# Patient Record
Sex: Male | Born: 1990 | Race: White | Hispanic: Yes | Marital: Single | Smoking: Never smoker
Health system: Southern US, Community
[De-identification: ages and names within clinical notes are randomized; demographics above are authoritative.]

## PROBLEM LIST (undated history)

## (undated) ENCOUNTER — Ambulatory Visit (HOSPITAL_COMMUNITY): Disposition: A | Discharge status: Home / Self Care | Payer: Managed Care, Other (non HMO)

## (undated) DIAGNOSIS — F32A Depression, unspecified: Secondary | ICD-10-CM

## (undated) DIAGNOSIS — F419 Anxiety disorder, unspecified: Secondary | ICD-10-CM

## (undated) DIAGNOSIS — F319 Bipolar disorder, unspecified: Secondary | ICD-10-CM

## (undated) DIAGNOSIS — K219 Gastro-esophageal reflux disease without esophagitis: Secondary | ICD-10-CM

## (undated) HISTORY — DX: Gastro-esophageal reflux disease without esophagitis: K21.9

## (undated) HISTORY — PX: OTHER SURGICAL HISTORY: SHX169

---

## 2011-09-11 ENCOUNTER — Encounter (HOSPITAL_COMMUNITY): Payer: Self-pay | Admitting: Emergency Medicine

## 2011-09-11 ENCOUNTER — Emergency Department (HOSPITAL_COMMUNITY)
Admission: EM | Admit: 2011-09-11 | Discharge: 2011-09-11 | Disposition: A | Discharge status: Home / Self Care | Payer: Self-pay | Attending: Emergency Medicine | Admitting: Emergency Medicine

## 2011-09-11 ENCOUNTER — Emergency Department (HOSPITAL_COMMUNITY): Payer: Self-pay

## 2011-09-11 DIAGNOSIS — R079 Chest pain, unspecified: Secondary | ICD-10-CM

## 2011-09-11 LAB — BASIC METABOLIC PANEL
BUN: 8 mg/dL (ref 6–23)
Calcium: 9.3 mg/dL (ref 8.4–10.5)
GFR calc non Af Amer: >90 mL/min (ref >90)
Glucose, Bld: 102 mg/dL — ABNORMAL HIGH (ref 70–99)
Sodium: 138 mEq/L (ref 135–145)

## 2011-09-11 LAB — CBC
Hemoglobin: 15.6 g/dL (ref 13.0–17.0)
MCH: 27.9 pg (ref 26.0–34.0)
MCHC: 34.4 g/dL (ref 30.0–36.0)

## 2011-09-11 NOTE — ED Provider Notes (Signed)
History     CSN: [id number]  Arrival date & time 09/11/11  7253   First MD Initiated Contact with Patient 09/11/11 0900      Chief Complaint  Patient presents with  . Chest Pain    (Consider location/radiation/quality/duration/timing/severity/associated sxs/prior treatment) Patient is a 21 y.o. male presenting with chest pain. The history is provided by the patient.  Chest Pain Pertinent negatives for primary symptoms include no fever, no shortness of breath, no cough, no palpitations, no abdominal pain and no vomiting.   pt c/o cp since yesterday. Constant. Pt describes vague chest tightness sensation midline/mid chest. Not pleuritic. Pain does not radiate. No worse whether upright or supine. At rest, states improved when working. States hx same symptoms in past intermittently but not this prolonged. Denies family hx premature cad. Denies smoking, no substance abuse, no cocaine abuse. Denies hx gerd. No cough or uri c/o. No associated sob, unusual doe, nv or diaphoresis. Denies any unusual stress or anxiety. No leg pain or swelling. No dvt or pe hx. No immobility, trauma, surgery. Denies palpitations. No heat intolerance, sweats, wt change, skin or hair changes. Denies hx chronic lung disease or asthma.     History reviewed. No pertinent past medical history.  History reviewed. No pertinent past surgical history.  No family history on file.  History  Substance Use Topics  . Smoking status: Never Smoker   . Smokeless tobacco: Not on file  . Alcohol Use: No      Review of Systems  Constitutional: Negative for fever and chills.  HENT: Negative for neck pain.   Eyes: Negative for redness.  Respiratory: Negative for cough and shortness of breath.   Cardiovascular: Positive for chest pain. Negative for palpitations and leg swelling.  Gastrointestinal: Negative for vomiting and abdominal pain.  Genitourinary: Negative for flank pain.  Musculoskeletal: Negative for back pain.    Skin: Negative for rash.  Neurological: Negative for headaches.  Hematological: Does not bruise/bleed easily.  Psychiatric/Behavioral: Negative for dysphoric mood.    Allergies  Review of patient's allergies indicates no known allergies.  Home Medications  No current outpatient prescriptions on file.  BP 145/89  Pulse 125  Temp 98.5 F (36.9 C) (Oral)  Resp 15  SpO2 100%  Physical Exam  Nursing note and vitals reviewed. Constitutional: He is oriented to person, place, and time. He appears well-developed and well-nourished. No distress.  HENT:  Head: Atraumatic.  Nose: Nose normal.  Mouth/Throat: Oropharynx is clear and moist.  Eyes: Conjunctivae are normal. Pupils are equal, round, and reactive to light.  Neck: Neck supple. No JVD present. No tracheal deviation present.  Cardiovascular: Normal rate, regular rhythm, normal heart sounds and intact distal pulses.  Exam reveals no gallop and no friction rub.   No murmur heard. Pulmonary/Chest: Effort normal and breath sounds normal. No accessory muscle usage. No respiratory distress. He exhibits no tenderness.  Abdominal: Soft. Bowel sounds are normal. He exhibits no distension. There is no tenderness.  Musculoskeletal: Normal range of motion. He exhibits no edema and no tenderness.  Neurological: He is alert and oriented to person, place, and time.  Skin: Skin is warm and dry.  Psychiatric: He has a normal mood and affect.    ED Course  Procedures (including critical care time)   Labs Reviewed  TROPONIN I  CBC  BASIC METABOLIC PANEL   Results for orders placed during the hospital encounter of 09/11/11  TROPONIN I      Component Value Range  Troponin I <0.30  <0.30 ng/mL  CBC      Component Value Range   WBC 11.9 (*) 4.0 - 10.5 K/uL   RBC 5.59  4.22 - 5.81 MIL/uL   Hemoglobin 15.6  13.0 - 17.0 g/dL   HCT 09.8  11.9 - 14.7 %   MCV 81.2  78.0 - 100.0 fL   MCH 27.9  26.0 - 34.0 pg   MCHC 34.4  30.0 - 36.0 g/dL    RDW 82.9  56.2 - 13.0 %   Platelets 322  150 - 400 K/uL  BASIC METABOLIC PANEL      Component Value Range   Sodium 138  135 - 145 mEq/L   Potassium 4.2  3.5 - 5.1 mEq/L   Chloride 101  96 - 112 mEq/L   CO2 28  19 - 32 mEq/L   Glucose, Bld 102 (*) 70 - 99 mg/dL   BUN 8  6 - 23 mg/dL   Creatinine, Ser 8.65  0.50 - 1.35 mg/dL   Calcium 9.3  8.4 - 78.4 mg/dL   GFR calc non Af Amer >90  >90 mL/min   GFR calc Af Amer >90  >90 mL/min   Dg Chest 2 View  09/11/2011  *RADIOLOGY REPORT*  Clinical Data: Chest pressure and tightness for 1 day.  CHEST - 2 VIEW  Comparison: None.  Findings: The heart, mediastinum and hila are within normal limits. The lungs are clear.  Normal bony thorax and surrounding soft tissues.  IMPRESSION: Normal chest radiographs.  Original Report Authenticated By: Domenic Moras, M.D.       MDM  Labs. Cxr.    Date: 09/11/2011  Rate: 114  Rhythm: sinus tachycardia  QRS Axis: normal  Intervals: normal  ST/T Wave abnormalities: normal  Conduction Disutrbances:none  Narrative Interpretation:   Old EKG Reviewed: none available   Recheck hr 94 rr 16, no distress.      Suzi Roots, MD 09/11/11 1056

## 2011-09-11 NOTE — ED Notes (Signed)
Pt c/o of chest tightness that started yesterday evening around 7pm. States that symptoms have occurred before but usually goes away. Denies SOB, anxiety, n/v/d

## 2011-09-15 ENCOUNTER — Encounter (HOSPITAL_COMMUNITY): Payer: Self-pay | Admitting: *Deleted

## 2011-09-15 ENCOUNTER — Emergency Department (HOSPITAL_COMMUNITY)
Admission: EM | Admit: 2011-09-15 | Discharge: 2011-09-15 | Disposition: A | Discharge status: Home / Self Care | Payer: Self-pay | Attending: Emergency Medicine | Admitting: Emergency Medicine

## 2011-09-15 DIAGNOSIS — R1013 Epigastric pain: Secondary | ICD-10-CM | POA: Insufficient documentation

## 2011-09-15 LAB — CBC WITH DIFFERENTIAL/PLATELET
Basophils Relative: 0 % (ref 0–1)
Eosinophils Absolute: 0.2 10*3/uL (ref 0.0–0.7)
MCH: 27.9 pg (ref 26.0–34.0)
MCHC: 34.2 g/dL (ref 30.0–36.0)
Neutrophils Relative %: 58 % (ref 43–77)
Platelets: 303 10*3/uL (ref 150–400)
RBC: 5.45 MIL/uL (ref 4.22–5.81)

## 2011-09-15 LAB — COMPREHENSIVE METABOLIC PANEL
ALT: 29 U/L (ref 0–53)
Albumin: 4.2 g/dL (ref 3.5–5.2)
Alkaline Phosphatase: 76 U/L (ref 39–117)
Potassium: 3.5 mEq/L (ref 3.5–5.1)
Sodium: 138 mEq/L (ref 135–145)
Total Protein: 7.6 g/dL (ref 6.0–8.3)

## 2011-09-15 MED ORDER — GI COCKTAIL ~~LOC~~
30.0000 mL | Freq: Once | ORAL | Status: AC
  Administered 2011-09-15: 30 mL via ORAL
  Filled 2011-09-15: qty 30

## 2011-09-15 MED ORDER — FAMOTIDINE 20 MG PO TABS: 20.0000 mg | ORAL_TABLET | Freq: Two times a day (BID) | ORAL | Status: DC

## 2011-09-15 NOTE — ED Notes (Signed)
Prescription given with discharge instructions.  

## 2011-09-15 NOTE — ED Provider Notes (Signed)
History     CSN: [id number]  Arrival date & time 09/15/11  0250   First MD Initiated Contact with Patient 09/15/11 [phone]      Chief Complaint  Patient presents with  . burping     (Consider location/radiation/quality/duration/timing/severity/associated sxs/prior treatment) HPI Comments: Patient states that for the last several, months he's had frequent episodes of burping and epigastric discomfort. He usually takes Alka-Seltzer with relief. Today. He spent birthing all day. Tried Alka-Seltzer and Advil relief. Today. He states that the pain radiated to his back as well. He does not have a family history of gallbladder disease. He states that eating food makes the discomfort worse. He said no, nausea or vomiting. Denies any fevers, shortness or dizziness    The history is provided by the patient.    History reviewed. No pertinent past medical history.  History reviewed. No pertinent past surgical history.  No family history on file.  History  Substance Use Topics  . Smoking status: Never Smoker   . Smokeless tobacco: Not on file  . Alcohol Use: No      Review of Systems  Constitutional: Negative for fever and chills.  HENT: Negative for rhinorrhea.   Respiratory: Negative for shortness of breath.   Cardiovascular: Negative for chest pain.  Gastrointestinal: Positive for abdominal pain. Negative for nausea and vomiting.  Skin: Negative for rash.  Neurological: Negative for dizziness, weakness and numbness.    Allergies  Review of patient's allergies indicates no known allergies.  Home Medications   Current Outpatient Rx  Name Route Sig Dispense Refill  . FAMOTIDINE 20 MG PO TABS Oral Take 1 tablet (20 mg total) by mouth 2 (two) times daily. 30 tablet 0    1 tablet twice a day for 7 days, then one tablet d ...    BP 146/82  Temp 97.8 F (36.6 C) (Oral)  Resp 18  SpO2 100%  Physical Exam  Constitutional: He appears well-developed and well-nourished.  HENT:   Head: Normocephalic.  Eyes: Pupils are equal, round, and reactive to light.  Cardiovascular: Normal rate.   Pulmonary/Chest: Effort normal. No respiratory distress. He has no wheezes. He has no rales. He exhibits no tenderness.  Abdominal: Soft. Bowel sounds are normal. He exhibits no distension. There is no hepatosplenomegaly. There is tenderness in the epigastric area.  Musculoskeletal: Normal range of motion.  Neurological: He is alert.  Skin: Skin is warm and dry.    ED Course  Procedures (including critical care time)  Labs Reviewed  CBC WITH DIFFERENTIAL - Abnormal; Notable for the following:    Monocytes Absolute 1.1 (*)     All other components within normal limits  COMPREHENSIVE METABOLIC PANEL   No results found.   1. Epigastric discomfort       MDM   That the patient be given a GI cocktail, also that CBC, and Cmet. be performed as his story is concerning for gallbladder disease Review of labs. There is no indication of gallbladder disease. No sign of infection at this time. Patient did receive quite a bit of relief from his discomfort after the GI cocktail. Will discharge patient home with a prescription for Pepcid twice a day for one week and Pepcid daily thereafter with a referral to GI for further evaluation as needed       Arman Filter, NP 09/15/11 0437  Arman Filter, NP 09/15/11 [phone]

## 2011-09-15 NOTE — ED Notes (Signed)
Burping all day today and c/o a burning sensation in his abd also

## 2011-09-15 NOTE — ED Provider Notes (Signed)
Medical screening examination/treatment/procedure(s) were performed by non-physician practitioner and as supervising physician I was immediately available for consultation/collaboration.  Olivia Mackie, MD 09/15/11 [phone]

## 2012-01-28 ENCOUNTER — Ambulatory Visit: Payer: Self-pay | Admitting: Internal Medicine

## 2012-02-22 ENCOUNTER — Ambulatory Visit (INDEPENDENT_AMBULATORY_CARE_PROVIDER_SITE_OTHER): Payer: Managed Care, Other (non HMO) | Admitting: Internal Medicine

## 2012-02-22 ENCOUNTER — Other Ambulatory Visit (INDEPENDENT_AMBULATORY_CARE_PROVIDER_SITE_OTHER): Payer: Managed Care, Other (non HMO)

## 2012-02-22 ENCOUNTER — Encounter: Payer: Self-pay | Admitting: Internal Medicine

## 2012-02-22 VITALS — BP 112/70 | HR 86 | Temp 97.7°F | Resp 10 | Ht 69.0 in | Wt 243.1 lb

## 2012-02-22 DIAGNOSIS — R7309 Other abnormal glucose: Secondary | ICD-10-CM

## 2012-02-22 DIAGNOSIS — Z0000 Encounter for general adult medical examination without abnormal findings: Secondary | ICD-10-CM

## 2012-02-22 DIAGNOSIS — K219 Gastro-esophageal reflux disease without esophagitis: Secondary | ICD-10-CM

## 2012-02-22 LAB — COMPREHENSIVE METABOLIC PANEL
Albumin: 4.3 g/dL (ref 3.5–5.2)
Alkaline Phosphatase: 72 U/L (ref 39–117)
BUN: 10 mg/dL (ref 6–23)
CO2: 28 mEq/L (ref 19–32)
Calcium: 9.2 mg/dL (ref 8.4–10.5)
GFR: 132.04 mL/min (ref >60.00)
Glucose, Bld: 92 mg/dL (ref 70–99)
Potassium: 4.6 mEq/L (ref 3.5–5.1)

## 2012-02-22 LAB — CBC WITH DIFFERENTIAL/PLATELET
Basophils Relative: 0.4 % (ref 0.0–3.0)
Eosinophils Relative: 1.8 % (ref 0.0–5.0)
Lymphocytes Relative: 27.8 % (ref 12.0–46.0)
MCV: 83.1 fl (ref 78.0–100.0)
Monocytes Relative: 8.5 % (ref 3.0–12.0)
Neutrophils Relative %: 61.5 % (ref 43.0–77.0)
RBC: 5.5 Mil/uL (ref 4.22–5.81)
WBC: 8.2 10*3/uL (ref 4.5–10.5)

## 2012-02-22 LAB — TSH: TSH: 1.4 u[IU]/mL (ref 0.35–5.50)

## 2012-02-22 LAB — URINALYSIS, ROUTINE W REFLEX MICROSCOPIC
Ketones, ur: NEGATIVE
Leukocytes, UA: NEGATIVE
Specific Gravity, Urine: 1.02 (ref 1.000–1.030)
Urine Glucose: NEGATIVE
pH: 6.5 (ref 5.0–8.0)

## 2012-02-22 LAB — LIPID PANEL
Cholesterol: 123 mg/dL (ref 0–200)
LDL Cholesterol: 76 mg/dL (ref 0–99)
Triglycerides: 67 mg/dL (ref 0.0–149.0)

## 2012-02-22 NOTE — Assessment & Plan Note (Signed)
I will check his a1c to see if he has developed DM

## 2012-02-22 NOTE — Progress Notes (Signed)
Subjective:    Patient ID: Trevor Brewer, male    DOB: 04-02-1990, 22 y.o.   MRN: [id number]  Gastrophageal Reflux He complains of heartburn. He reports no abdominal pain, no belching, no chest pain, no choking, no coughing, no dysphagia, no early satiety, no globus sensation, no hoarse voice, no nausea, no sore throat, no stridor, no tooth decay, no water brash or no wheezing. This is a chronic problem. The current episode started more than 1 year ago. The problem occurs rarely. The problem has been gradually improving. The heartburn duration is several minutes. The heartburn is located in the substernum. The heartburn is of mild intensity. The heartburn does not wake him from sleep. The heartburn does not limit his activity. The heartburn doesn't change with position. Nothing aggravates the symptoms. Pertinent negatives include no anemia, fatigue, melena, muscle weakness, orthopnea or weight loss. Risk factors include lack of exercise. He has tried a histamine-2 antagonist for the symptoms. The treatment provided significant relief.      Review of Systems  Constitutional: Positive for activity change (increased appetite). Negative for fever, chills, weight loss, diaphoresis, appetite change, fatigue and unexpected weight change.  HENT: Negative.  Negative for sore throat and hoarse voice.   Eyes: Negative.   Respiratory: Negative for cough, choking, chest tightness, shortness of breath, wheezing and stridor.   Cardiovascular: Negative for chest pain, palpitations and leg swelling.  Gastrointestinal: Positive for heartburn. Negative for dysphagia, nausea, vomiting, abdominal pain, diarrhea, constipation, melena and abdominal distention.  Genitourinary: Positive for frequency. Negative for dysuria, urgency, hematuria, flank pain, decreased urine volume, discharge, penile swelling, scrotal swelling, enuresis, difficulty urinating, genital sores, penile pain and testicular pain.  Musculoskeletal:  Negative.  Negative for muscle weakness.  Skin: Negative.   Neurological: Negative.  Negative for dizziness, speech difficulty, weakness and light-headedness.  Hematological: Negative for adenopathy. Does not bruise/bleed easily.  Psychiatric/Behavioral: Negative.        Objective:   Physical Exam  Vitals reviewed. Constitutional: He is oriented to person, place, and time. He appears well-developed and well-nourished. No distress.  HENT:  Head: Normocephalic and atraumatic.  Mouth/Throat: Oropharynx is clear and moist. No oropharyngeal exudate.  Eyes: Conjunctivae normal are normal. Right eye exhibits no discharge. Left eye exhibits no discharge. No scleral icterus.  Neck: Normal range of motion. Neck supple. No JVD present. No tracheal deviation present. No thyromegaly present.  Cardiovascular: Normal rate, regular rhythm, normal heart sounds and intact distal pulses.  Exam reveals no gallop and no friction rub.   No murmur heard. Pulmonary/Chest: Effort normal and breath sounds normal. No stridor. No respiratory distress. He has no wheezes. He has no rales. He exhibits no tenderness.  Abdominal: Soft. Bowel sounds are normal. He exhibits no distension and no mass. There is no tenderness. There is no rebound and no guarding. Hernia confirmed negative in the right inguinal area and confirmed negative in the left inguinal area.  Genitourinary: Testes normal and penis normal. Right testis shows no mass, no swelling and no tenderness. Right testis is descended. Left testis shows no mass, no swelling and no tenderness. Left testis is descended. Uncircumcised. No phimosis, paraphimosis, hypospadias, penile erythema or penile tenderness. No discharge found.  Musculoskeletal: Normal range of motion. He exhibits no edema and no tenderness.  Lymphadenopathy:    He has no cervical adenopathy.       Right: No inguinal adenopathy present.       Left: No inguinal adenopathy present.  Neurological: He  is  oriented to person, place, and time.  Skin: Skin is warm and dry. No rash noted. He is not diaphoretic. No erythema. No pallor.  Psychiatric: He has a normal mood and affect. His behavior is normal. Judgment and thought content normal.      Lab Results  Component Value Date   WBC 9.8 09/15/2011   HGB 15.2 09/15/2011   HCT 44.5 09/15/2011   PLT 303 09/15/2011   GLUCOSE 99 09/15/2011   ALT 29 09/15/2011   AST 20 09/15/2011   NA 138 09/15/2011   K 3.5 09/15/2011   CL 101 09/15/2011   CREATININE 0.70 09/15/2011   BUN 7 09/15/2011   CO2 26 09/15/2011      Assessment & Plan:

## 2012-02-22 NOTE — Assessment & Plan Note (Signed)
He is doing well on the H2 blocker

## 2012-02-22 NOTE — Patient Instructions (Signed)
Health Maintenance, Males A healthy lifestyle and preventative care can promote health and wellness.  Maintain regular health, dental, and eye exams.  Eat a healthy diet. Foods like vegetables, fruits, whole grains, low-fat dairy products, and lean protein foods contain the nutrients you need without too many calories. Decrease your intake of foods high in solid fats, added sugars, and salt. Get information about a proper diet from your caregiver, if necessary.  Regular physical exercise is one of the most important things you can do for your health. Most adults should get at least 150 minutes of moderate-intensity exercise (any activity that increases your heart rate and causes you to sweat) each week. In addition, most adults need muscle-strengthening exercises on 2 or more days a week.   Maintain a healthy weight. The body mass index (BMI) is a screening tool to identify possible weight problems. It provides an estimate of body fat based on height and weight. Your caregiver can help determine your BMI, and can help you achieve or maintain a healthy weight. For adults 20 years and older:  A BMI below 18.5 is considered underweight.  A BMI of 18.5 to 24.9 is normal.  A BMI of 25 to 29.9 is considered overweight.  A BMI of 30 and above is considered obese.  Maintain normal blood lipids and cholesterol by exercising and minimizing your intake of saturated fat. Eat a balanced diet with plenty of fruits and vegetables. Blood tests for lipids and cholesterol should begin at age 20 and be repeated every 5 years. If your lipid or cholesterol levels are high, you are over 50, or you are a high risk for heart disease, you may need your cholesterol levels checked more frequently.Ongoing high lipid and cholesterol levels should be treated with medicines, if diet and exercise are not effective.  If you smoke, find out from your caregiver how to quit. If you do not use tobacco, do not start.  If you  choose to drink alcohol, do not exceed 2 drinks per day. One drink is considered to be 12 ounces (355 mL) of beer, 5 ounces (148 mL) of wine, or 1.5 ounces (44 mL) of liquor.  Avoid use of street drugs. Do not share needles with anyone. Ask for help if you need support or instructions about stopping the use of drugs.  High blood pressure causes heart disease and increases the risk of stroke. Blood pressure should be checked at least every 1 to 2 years. Ongoing high blood pressure should be treated with medicines if weight loss and exercise are not effective.  If you are 45 to 23 years old, ask your caregiver if you should take aspirin to prevent heart disease.  Diabetes screening involves taking a blood sample to check your fasting blood sugar level. This should be done once every 3 years, after age 45, if you are within normal weight and without risk factors for diabetes. Testing should be considered at a younger age or be carried out more frequently if you are overweight and have at least 1 risk factor for diabetes.  Colorectal cancer can be detected and often prevented. Most routine colorectal cancer screening begins at the age of 50 and continues through age 75. However, your caregiver may recommend screening at an earlier age if you have risk factors for colon cancer. On a yearly basis, your caregiver may provide home test kits to check for hidden blood in the stool. Use of a small camera at the end of a tube,   to directly examine the colon (sigmoidoscopy or colonoscopy), can detect the earliest forms of colorectal cancer. Talk to your caregiver about this at age 50, when routine screening begins. Direct examination of the colon should be repeated every 5 to 10 years through age 75, unless early forms of pre-cancerous polyps or small growths are found.  Hepatitis C blood testing is recommended for all people born from 1945 through 1965 and any individual with known risks for hepatitis C.  Healthy  men should no longer receive prostate-specific antigen (PSA) blood tests as part of routine cancer screening. Consult with your caregiver about prostate cancer screening.  Testicular cancer screening is not recommended for adolescents or adult males who have no symptoms. Screening includes self-exam, caregiver exam, and other screening tests. Consult with your caregiver about any symptoms you have or any concerns you have about testicular cancer.  Practice safe sex. Use condoms and avoid high-risk sexual practices to reduce the spread of sexually transmitted infections (STIs).  Use sunscreen with a sun protection factor (SPF) of 30 or greater. Apply sunscreen liberally and repeatedly throughout the day. You should seek shade when your shadow is shorter than you. Protect yourself by wearing long sleeves, pants, a wide-brimmed hat, and sunglasses year round, whenever you are outdoors.  Notify your caregiver of new moles or changes in moles, especially if there is a change in shape or color. Also notify your caregiver if a mole is larger than the size of a pencil eraser.  A one-time screening for abdominal aortic aneurysm (AAA) and surgical repair of large AAAs by sound wave imaging (ultrasonography) is recommended for ages 65 to 75 years who are current or former smokers.  Stay current with your immunizations. Document Released: 07/14/2007 Document Revised: 04/09/2011 Document Reviewed: 06/12/2010 [employer] Patient Information 2013 [employer].  

## 2012-02-22 NOTE — Assessment & Plan Note (Signed)
Exam done Vaccines were reviewed Labs ordered Pt ed material was given 

## 2012-02-25 ENCOUNTER — Encounter: Payer: Self-pay | Admitting: Internal Medicine

## 2012-06-19 ENCOUNTER — Encounter: Payer: Self-pay | Admitting: Internal Medicine

## 2012-06-19 ENCOUNTER — Ambulatory Visit (INDEPENDENT_AMBULATORY_CARE_PROVIDER_SITE_OTHER): Payer: Managed Care, Other (non HMO) | Admitting: Internal Medicine

## 2012-06-19 ENCOUNTER — Ambulatory Visit (INDEPENDENT_AMBULATORY_CARE_PROVIDER_SITE_OTHER)
Admission: RE | Admit: 2012-06-19 | Discharge: 2012-06-19 | Disposition: A | Discharge status: Home / Self Care | Payer: Managed Care, Other (non HMO) | Source: Ambulatory Visit | Attending: Internal Medicine | Admitting: Internal Medicine

## 2012-06-19 ENCOUNTER — Other Ambulatory Visit (INDEPENDENT_AMBULATORY_CARE_PROVIDER_SITE_OTHER): Payer: Managed Care, Other (non HMO)

## 2012-06-19 VITALS — BP 114/70 | HR 107 | Temp 99.0°F | Resp 16 | Wt 247.0 lb

## 2012-06-19 DIAGNOSIS — R079 Chest pain, unspecified: Secondary | ICD-10-CM

## 2012-06-19 DIAGNOSIS — R000 Tachycardia, unspecified: Secondary | ICD-10-CM

## 2012-06-19 LAB — CBC WITH DIFFERENTIAL/PLATELET
Basophils Relative: 0.5 % (ref 0.0–3.0)
Eosinophils Absolute: 0.1 10*3/uL (ref 0.0–0.7)
Eosinophils Relative: 1.4 % (ref 0.0–5.0)
Lymphocytes Relative: 18.6 % (ref 12.0–46.0)
Neutrophils Relative %: 70.9 % (ref 43.0–77.0)
Platelets: 327 10*3/uL (ref 150.0–400.0)
RBC: 5.71 Mil/uL (ref 4.22–5.81)
WBC: 8.7 10*3/uL (ref 4.5–10.5)

## 2012-06-19 LAB — CARDIAC PANEL
CK-MB: 1.6 ng/mL (ref 0.3–4.0)
Relative Index: 0.5 calc (ref 0.0–2.5)
Total CK: 346 U/L — ABNORMAL HIGH (ref 7–232)

## 2012-06-19 LAB — COMPREHENSIVE METABOLIC PANEL
AST: 24 U/L (ref 0–37)
Albumin: 4.2 g/dL (ref 3.5–5.2)
Alkaline Phosphatase: 71 U/L (ref 39–117)
BUN: 10 mg/dL (ref 6–23)
Calcium: 9.3 mg/dL (ref 8.4–10.5)
Chloride: 102 mEq/L (ref 96–112)
Glucose, Bld: 85 mg/dL (ref 70–99)
Potassium: 4.4 mEq/L (ref 3.5–5.1)
Sodium: 136 mEq/L (ref 135–145)
Total Protein: 7.8 g/dL (ref 6.0–8.3)

## 2012-06-19 LAB — BRAIN NATRIURETIC PEPTIDE: Pro B Natriuretic peptide (BNP): 10 pg/mL (ref 0.0–100.0)

## 2012-06-19 NOTE — Assessment & Plan Note (Signed)
I will check his labs and UDS to look for secondary causes for this I have asked him to wear a Holter monitor to ses how high his HR is getting and to see if there is any dysrhythmia

## 2012-06-19 NOTE — Progress Notes (Signed)
Subjective:    Patient ID: Trevor Brewer, male    DOB: Jun 05, 1990, 22 y.o.   MRN: [id number]  Chest Pain  This is a recurrent problem. Episode onset: 3 weeks ago. The onset quality is gradual. The problem occurs intermittently. The problem has been unchanged. The pain is present in the lateral region (right side of his chest). The pain is at a severity of 2/10. The pain is mild. The quality of the pain is described as sharp and stabbing. The pain does not radiate. Associated symptoms include palpitations. Pertinent negatives include no abdominal pain, back pain, claudication, cough, diaphoresis, dizziness, exertional chest pressure, fever, headaches, hemoptysis, irregular heartbeat, leg pain, lower extremity edema, malaise/fatigue, nausea, near-syncope, numbness, orthopnea, PND, shortness of breath, sputum production, syncope, vomiting or weakness. The pain is aggravated by emotional upset. He has tried NSAIDs for the symptoms. The treatment provided mild relief.      Review of Systems  Constitutional: Negative.  Negative for fever, chills, malaise/fatigue, diaphoresis, activity change, appetite change, fatigue and unexpected weight change.  HENT: Negative.   Eyes: Negative.   Respiratory: Negative.  Negative for apnea, cough, hemoptysis, sputum production, choking, chest tightness, shortness of breath, wheezing and stridor.   Cardiovascular: Positive for chest pain and palpitations. Negative for orthopnea, claudication, leg swelling, syncope, PND and near-syncope.  Gastrointestinal: Negative.  Negative for nausea, vomiting, abdominal pain, diarrhea and constipation.  Endocrine: Negative.   Genitourinary: Negative.   Musculoskeletal: Negative.  Negative for back pain.  Skin: Negative.   Allergic/Immunologic: Negative.   Neurological: Negative.  Negative for dizziness, weakness, numbness and headaches.  Hematological: Negative.  Negative for adenopathy. Does not bruise/bleed easily.   Psychiatric/Behavioral: Negative for suicidal ideas, hallucinations, behavioral problems, confusion, sleep disturbance, self-injury, dysphoric mood, decreased concentration and agitation. The patient is nervous/anxious. The patient is not hyperactive.        Objective:   Physical Exam  Vitals reviewed. Constitutional: He is oriented to person, place, and time. He appears well-developed and well-nourished. No distress.  HENT:  Head: Normocephalic and atraumatic.  Mouth/Throat: Oropharynx is clear and moist. No oropharyngeal exudate.  Eyes: Conjunctivae are normal. Right eye exhibits no discharge. Left eye exhibits no discharge. No scleral icterus.  Neck: Normal range of motion. Neck supple. No JVD present. No tracheal deviation present. No thyromegaly present.  Cardiovascular: Regular rhythm, S1 normal, S2 normal, normal heart sounds and intact distal pulses.   No extrasystoles are present. Tachycardia present.  Exam reveals no gallop.   No murmur heard. Pulses:      Carotid pulses are 1+ on the right side, and 1+ on the left side.      Radial pulses are 1+ on the right side, and 1+ on the left side.       Femoral pulses are 1+ on the right side, and 1+ on the left side.      Popliteal pulses are 1+ on the right side, and 1+ on the left side.       Dorsalis pedis pulses are 1+ on the right side, and 1+ on the left side.       Posterior tibial pulses are 1+ on the right side, and 1+ on the left side.  Pulmonary/Chest: Effort normal and breath sounds normal. No stridor. No respiratory distress. He has no wheezes. He has no rales. He exhibits no tenderness.  Abdominal: Soft. Bowel sounds are normal. He exhibits no distension and no mass. There is no tenderness. There is no rebound and no  guarding.  Musculoskeletal: Normal range of motion. He exhibits no edema and no tenderness.  Lymphadenopathy:    He has no cervical adenopathy.  Neurological: He is oriented to person, place, and time.   Skin: Skin is warm and dry. He is not diaphoretic.  Psychiatric: His speech is normal and behavior is normal. Judgment and thought content normal. His mood appears anxious. His affect is not angry, not labile and not inappropriate. Cognition and memory are normal. He does not exhibit a depressed mood.          Assessment & Plan:

## 2012-06-19 NOTE — Patient Instructions (Signed)
Chest Pain (Nonspecific) °It is often hard to give a specific diagnosis for the cause of chest pain. There is always a chance that your pain could be related to something serious, such as a heart attack or a blood clot in the lungs. You need to follow up with your caregiver for further evaluation. °CAUSES  °· Heartburn. °· Pneumonia or bronchitis. °· Anxiety or stress. °· Inflammation around your heart (pericarditis) or lung (pleuritis or pleurisy). °· A blood clot in the lung. °· A collapsed lung (pneumothorax). It can develop suddenly on its own (spontaneous pneumothorax) or from injury (trauma) to the chest. °· Shingles infection (herpes zoster virus). °The chest wall is composed of bones, muscles, and cartilage. Any of these can be the source of the pain. °· The bones can be bruised by injury. °· The muscles or cartilage can be strained by coughing or overwork. °· The cartilage can be affected by inflammation and become sore (costochondritis). °DIAGNOSIS  °Lab tests or other studies, such as X-rays, electrocardiography, stress testing, or cardiac imaging, may be needed to find the cause of your pain.  °TREATMENT  °· Treatment depends on what may be causing your chest pain. Treatment may include: °· Acid blockers for heartburn. °· Anti-inflammatory medicine. °· Pain medicine for inflammatory conditions. °· Antibiotics if an infection is present. °· You may be advised to change lifestyle habits. This includes stopping smoking and avoiding alcohol, caffeine, and chocolate. °· You may be advised to keep your head raised (elevated) when sleeping. This reduces the chance of acid going backward from your stomach into your esophagus. °· Most of the time, nonspecific chest pain will improve within 2 to 3 days with rest and mild pain medicine. °HOME CARE INSTRUCTIONS  °· If antibiotics were prescribed, take your antibiotics as directed. Finish them even if you start to feel better. °· For the next few days, avoid physical  activities that bring on chest pain. Continue physical activities as directed. °· Do not smoke. °· Avoid drinking alcohol. °· Only take over-the-counter or prescription medicine for pain, discomfort, or fever as directed by your caregiver. °· Follow your caregiver's suggestions for further testing if your chest pain does not go away. °· Keep any follow-up appointments you made. If you do not go to an appointment, you could develop lasting (chronic) problems with pain. If there is any problem keeping an appointment, you must call to reschedule. °SEEK MEDICAL CARE IF:  °· You think you are having problems from the medicine you are taking. Read your medicine instructions carefully. °· Your chest pain does not go away, even after treatment. °· You develop a rash with blisters on your chest. °SEEK IMMEDIATE MEDICAL CARE IF:  °· You have increased chest pain or pain that spreads to your arm, neck, jaw, back, or abdomen. °· You develop shortness of breath, an increasing cough, or you are coughing up blood. °· You have severe back or abdominal pain, feel nauseous, or vomit. °· You develop severe weakness, fainting, or chills. °· You have a fever. °THIS IS AN EMERGENCY. Do not wait to see if the pain will go away. Get medical help at once. Call your local emergency services (911 in [place] Do not drive yourself to the hospital. °MAKE SURE YOU:  °· Understand these instructions. °· Will watch your condition. °· Will get help right away if you are not doing well or get worse. °Document Released: 10/25/2004 Document Revised: 04/09/2011 Document Reviewed: 08/21/2007 °ExitCare® Patient Information ©2014 [employer],   LL[employer]. ° °

## 2012-06-19 NOTE — Assessment & Plan Note (Addendum)
CXR is normal Labs are ok (CK elevation is not CK-MB) EKG shows tachy but it is o/w normal I think this is anxiety/MS pain, I will check his UDS

## 2012-06-20 LAB — DRUGS OF ABUSE SCREEN W/O ALC, ROUTINE URINE
Amphetamine Screen, Ur: NEGATIVE
Barbiturate Quant, Ur: NEGATIVE
Benzodiazepines.: NEGATIVE
Marijuana Metabolite: NEGATIVE
Methadone: NEGATIVE
Propoxyphene: NEGATIVE

## 2012-07-01 ENCOUNTER — Encounter (INDEPENDENT_AMBULATORY_CARE_PROVIDER_SITE_OTHER): Payer: Managed Care, Other (non HMO)

## 2012-07-01 ENCOUNTER — Encounter: Payer: Self-pay | Admitting: *Deleted

## 2012-07-01 DIAGNOSIS — R000 Tachycardia, unspecified: Secondary | ICD-10-CM

## 2012-07-01 DIAGNOSIS — R002 Palpitations: Secondary | ICD-10-CM

## 2012-07-01 NOTE — Progress Notes (Signed)
Patient ID: Trevor Brewer, male   DOB: 1990/10/31, 22 y.o.   MRN: [id number] E-Cardio 24 hour Holter monitor placed on patient.

## 2012-07-04 ENCOUNTER — Other Ambulatory Visit: Payer: Self-pay | Admitting: Internal Medicine

## 2012-07-04 DIAGNOSIS — R000 Tachycardia, unspecified: Secondary | ICD-10-CM

## 2012-07-10 ENCOUNTER — Telehealth: Payer: Self-pay | Admitting: Internal Medicine

## 2012-07-10 NOTE — Telephone Encounter (Signed)
I have called him twice to inform him of the elevated heart rate on the holter monitor, he has not returns my phone calls. I have referred him to cardiology.

## 2012-08-28 ENCOUNTER — Institutional Professional Consult (permissible substitution): Payer: Managed Care, Other (non HMO) | Admitting: Cardiovascular Disease

## 2012-09-09 ENCOUNTER — Encounter: Payer: Self-pay | Admitting: Cardiovascular Disease

## 2014-02-16 ENCOUNTER — Emergency Department (HOSPITAL_COMMUNITY)
Admission: EM | Admit: 2014-02-16 | Discharge: 2014-02-16 | Disposition: A | Discharge status: Home / Self Care | Payer: Managed Care, Other (non HMO) | Attending: Emergency Medicine | Admitting: Emergency Medicine

## 2014-02-16 ENCOUNTER — Emergency Department (HOSPITAL_COMMUNITY): Payer: Managed Care, Other (non HMO)

## 2014-02-16 ENCOUNTER — Encounter (HOSPITAL_COMMUNITY): Payer: Self-pay

## 2014-02-16 DIAGNOSIS — K219 Gastro-esophageal reflux disease without esophagitis: Secondary | ICD-10-CM | POA: Insufficient documentation

## 2014-02-16 DIAGNOSIS — Z79899 Other long term (current) drug therapy: Secondary | ICD-10-CM | POA: Insufficient documentation

## 2014-02-16 DIAGNOSIS — Y998 Other external cause status: Secondary | ICD-10-CM | POA: Insufficient documentation

## 2014-02-16 DIAGNOSIS — Y9389 Activity, other specified: Secondary | ICD-10-CM | POA: Insufficient documentation

## 2014-02-16 DIAGNOSIS — S3992XA Unspecified injury of lower back, initial encounter: Secondary | ICD-10-CM | POA: Insufficient documentation

## 2014-02-16 DIAGNOSIS — S4992XA Unspecified injury of left shoulder and upper arm, initial encounter: Secondary | ICD-10-CM | POA: Insufficient documentation

## 2014-02-16 DIAGNOSIS — Y92410 Unspecified street and highway as the place of occurrence of the external cause: Secondary | ICD-10-CM | POA: Insufficient documentation

## 2014-02-16 DIAGNOSIS — S52612A Displaced fracture of left ulna styloid process, initial encounter for closed fracture: Secondary | ICD-10-CM

## 2014-02-16 DIAGNOSIS — S199XXA Unspecified injury of neck, initial encounter: Secondary | ICD-10-CM | POA: Insufficient documentation

## 2014-02-16 DIAGNOSIS — S52615A Nondisplaced fracture of left ulna styloid process, initial encounter for closed fracture: Secondary | ICD-10-CM | POA: Insufficient documentation

## 2014-02-16 MED ORDER — HYDROCODONE-ACETAMINOPHEN 5-325 MG PO TABS: ORAL_TABLET | ORAL | Status: DC

## 2014-02-16 MED ORDER — IBUPROFEN 600 MG PO TABS: 600.0000 mg | ORAL_TABLET | Freq: Four times a day (QID) | ORAL | Status: DC | PRN

## 2014-02-16 MED ORDER — IBUPROFEN 800 MG PO TABS
800.0000 mg | ORAL_TABLET | Freq: Once | ORAL | Status: AC
  Administered 2014-02-16: 800 mg via ORAL
  Filled 2014-02-16: qty 1
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
## 2014-08-05 ENCOUNTER — Emergency Department (HOSPITAL_COMMUNITY)
Admission: EM | Admit: 2014-08-05 | Discharge: 2014-08-05 | Disposition: A | Discharge status: Home / Self Care | Payer: Managed Care, Other (non HMO) | Attending: Emergency Medicine | Admitting: Emergency Medicine

## 2014-08-05 ENCOUNTER — Encounter (HOSPITAL_COMMUNITY): Payer: Self-pay | Admitting: *Deleted

## 2014-08-05 DIAGNOSIS — R319 Hematuria, unspecified: Secondary | ICD-10-CM | POA: Diagnosis not present

## 2014-08-05 DIAGNOSIS — R369 Urethral discharge, unspecified: Secondary | ICD-10-CM | POA: Diagnosis present

## 2014-08-05 DIAGNOSIS — K219 Gastro-esophageal reflux disease without esophagitis: Secondary | ICD-10-CM | POA: Diagnosis not present

## 2014-08-05 DIAGNOSIS — Z79899 Other long term (current) drug therapy: Secondary | ICD-10-CM | POA: Diagnosis not present

## 2014-08-05 LAB — URINALYSIS, ROUTINE W REFLEX MICROSCOPIC
Bilirubin Urine: NEGATIVE
GLUCOSE, UA: NEGATIVE mg/dL
Ketones, ur: NEGATIVE mg/dL
Leukocytes, UA: NEGATIVE
Nitrite: NEGATIVE
Protein, ur: NEGATIVE mg/dL
SPECIFIC GRAVITY, URINE: 1.023 (ref 1.005–1.030)
Urobilinogen, UA: 1 mg/dL (ref 0.0–1.0)
pH: 6 (ref 5.0–8.0)

## 2014-08-05 LAB — URINE MICROSCOPIC-ADD ON

## 2014-08-05 NOTE — Discharge Instructions (Signed)
Hematuria Hematuria is blood in your urine. It can be caused by a bladder infection, kidney infection, prostate infection, kidney stone, or cancer of your urinary tract. Infections can usually be treated with medicine, and a kidney stone usually will pass through your urine. If neither of these is the cause of your hematuria, further workup to find out the reason may be needed. It is very important that you tell your health care provider about any blood you see in your urine, even if the blood stops without treatment or happens without causing pain. Blood in your urine that happens and then stops and then happens again can be a symptom of a very serious condition. Also, pain is not a symptom in the initial stages of many urinary cancers. HOME CARE INSTRUCTIONS   Drink lots of fluid, 3-4 quarts a day. If you have been diagnosed with an infection, cranberry juice is especially recommended, in addition to large amounts of water.  Avoid caffeine, tea, and carbonated beverages because they tend to irritate the bladder.  Avoid alcohol because it may irritate the prostate.  Take all medicines as directed by your health care provider.  If you were prescribed an antibiotic medicine, finish it all even if you start to feel better.  If you have been diagnosed with a kidney stone, follow your health care provider's instructions regarding straining your urine to catch the stone.  Empty your bladder often. Avoid holding urine for long periods of time.  After a bowel movement, women should cleanse front to back. Use each tissue only once.  Empty your bladder before and after sexual intercourse if you are a male. SEEK MEDICAL CARE IF:  You develop back pain.  You have a fever.  You have a feeling of sickness in your stomach (nausea) or vomiting.  Your symptoms are not better in 3 days. Return sooner if you are getting worse. SEEK IMMEDIATE MEDICAL CARE IF:   You develop severe vomiting and are  unable to keep the medicine down.  You develop severe back or abdominal pain despite taking your medicines.  You begin passing a large amount of blood or clots in your urine.  You feel extremely weak or faint, or you pass out. MAKE SURE YOU:   Understand these instructions.  Will watch your condition.  Will get help right away if you are not doing well or get worse. Document Released: 01/15/2005 Document Revised: 06/01/2013 Document Reviewed: 09/15/2012 [facility] Patient Information 2015 [place]ExitCare, [place]LL[place]. This information is not intended to replace advice given to you by your health care provider. Make sure you discuss any questions you have with your health care provider. Your urine shows microscopic hematuria or blood in your urine without any sign of infection.  Please call the urologist to make an appointment for further evaluation.  Return or HIV.  If you develop fever, nausea, vomiting

## 2014-08-05 NOTE — ED Notes (Signed)
Pt states his ejaculate had a reddish tinge to it today after masturbating. Pt states his ejaculate was normal yesterday. Pt also complains of burning when he urinates, starting at 12PM today.

## 2014-08-05 NOTE — ED Provider Notes (Signed)
CSN: [id number]     Arrival date & time 08/05/14  2009 History  This chart was scribed for Earley FavorGail Gurtej Noyola, NP, working with Arby BarretteMarcy Pfeiffer, MD by Chestine SporeSoijett Blue, ED Scribe. The patient was seen in room WTR9/WTR9 at 8:23 PM.      Chief Complaint  Patient presents with  . Penile Discharge      The history is provided by the patient. No language interpreter was used.    HPI Comments: Trevor Brewer is a 24 y.o. male who presents to the Emergency Department complaining of penile discharge onset 1-2 hours ago. Pt notes that he noticed that his ejaculate today had a red tinge color to it and his ejaculate yesterday was normal. Pt last had protected sex 1 week ago. He states that he is having associated symptoms of dysuria beginning at noon today. He denies hematuria and any other symptoms.   Past Medical History  Diagnosis Date  . GERD (gastroesophageal reflux disease)    Past Surgical History  Procedure Laterality Date  . Arm surgery      fracture   Family History  Problem Relation Age of Onset  . Diabetes Paternal Uncle   . Hypertension Paternal Uncle   . Alcohol abuse Neg Hx   . Cancer Neg Hx   . Early death Neg Hx   . Heart disease Neg Hx   . Hyperlipidemia Neg Hx   . Kidney disease Neg Hx   . Stroke Neg Hx    History  Substance Use Topics  . Smoking status: Never Smoker   . Smokeless tobacco: Never Used  . Alcohol Use: 1.8 oz/week    3 Shots of liquor per week    Review of Systems  Genitourinary: Positive for dysuria and discharge. Negative for hematuria, penile pain and testicular pain.      Allergies  Review of patient's allergies indicates no known allergies.  Home Medications   Prior to Admission medications   Medication Sig Start Date End Date Taking? Authorizing Provider  cetirizine (ZYRTEC) 10 MG tablet Take 10 mg by mouth daily as needed for allergies.   Yes Historical Provider, MD  famotidine (PEPCID) 10 MG tablet Take 10 mg by mouth 2 (two) times daily  as needed for heartburn or indigestion.   Yes Historical Provider, MD  hydrocortisone cream 1 % Apply 1 application topically 2 (two) times a week.   Yes Historical Provider, MD  HYDROcodone-acetaminophen (NORCO/VICODIN) 5-325 MG per tablet Take 1-2 tablets every 6 hours as needed for severe pain Patient not taking: Reported on 08/05/2014 02/16/14   Renne CriglerJoshua Geiple, PA-C  ibuprofen (ADVIL,MOTRIN) 600 MG tablet Take 1 tablet (600 mg total) by mouth every 6 (six) hours as needed. Patient not taking: Reported on 08/05/2014 02/16/14   Renne CriglerJoshua Geiple, PA-C   BP 129/80 mmHg  Pulse 99  Temp(Src) 98.8 F (37.1 C) (Oral)  Resp 18  SpO2 97% Physical Exam  Constitutional: He is oriented to person, place, and time. He appears well-developed and well-nourished. No distress.  HENT:  Head: Normocephalic and atraumatic.  Eyes: EOM are normal.  Neck: Neck supple.  Cardiovascular: Normal rate.   Pulmonary/Chest: Effort normal. No respiratory distress.  Genitourinary: Penis normal. Right testis shows no tenderness. Left testis shows no tenderness. No penile tenderness.  Musculoskeletal: Normal range of motion.  Neurological: He is alert and oriented to person, place, and time.  Skin: Skin is warm and dry.  Psychiatric: He has a normal mood and affect. His behavior is normal.  Nursing note and vitals reviewed.   ED Course  Procedures (including critical care time) DIAGNOSTIC STUDIES: Oxygen Saturation is 97% on RA, nl by my interpretation.    COORDINATION OF CARE: 8:23 PM-Discussed treatment plan which includes UA with pt at bedside and pt agreed to plan.   Labs Review Labs Reviewed  URINALYSIS, ROUTINE W REFLEX MICROSCOPIC (NOT AT [facility]) - Abnormal; Notable for the following:    Hgb urine dipstick MODERATE (*)    All other components within normal limits  URINE MICROSCOPIC-ADD ON - Abnormal; Notable for the following:    Squamous Epithelial / LPF FEW (*)    All other components within normal limits     Imaging Review No results found.   EKG Interpretation None     Patient has been cautioned to stop daily masturbation until evaluated by urology MDM   Final diagnoses:  Painless hematuria    I personally performed the services described in this documentation, which was scribed in my presence. The recorded information has been reviewed and is accurate.   Earley Favor, NP 08/05/14 2152  Earley Favor, NP 08/05/14 0272  Arby Barrette, MD 08/09/14 [phone]

## 2014-10-07 IMAGING — CR DG CHEST 2V
2 series · 2 of 2 positions shown · non-contrast
Comparison: 09/11/2011

CLINICAL DATA: Chest pain

CHEST - 2 VIEW

[view not recorded (1 of 2)]
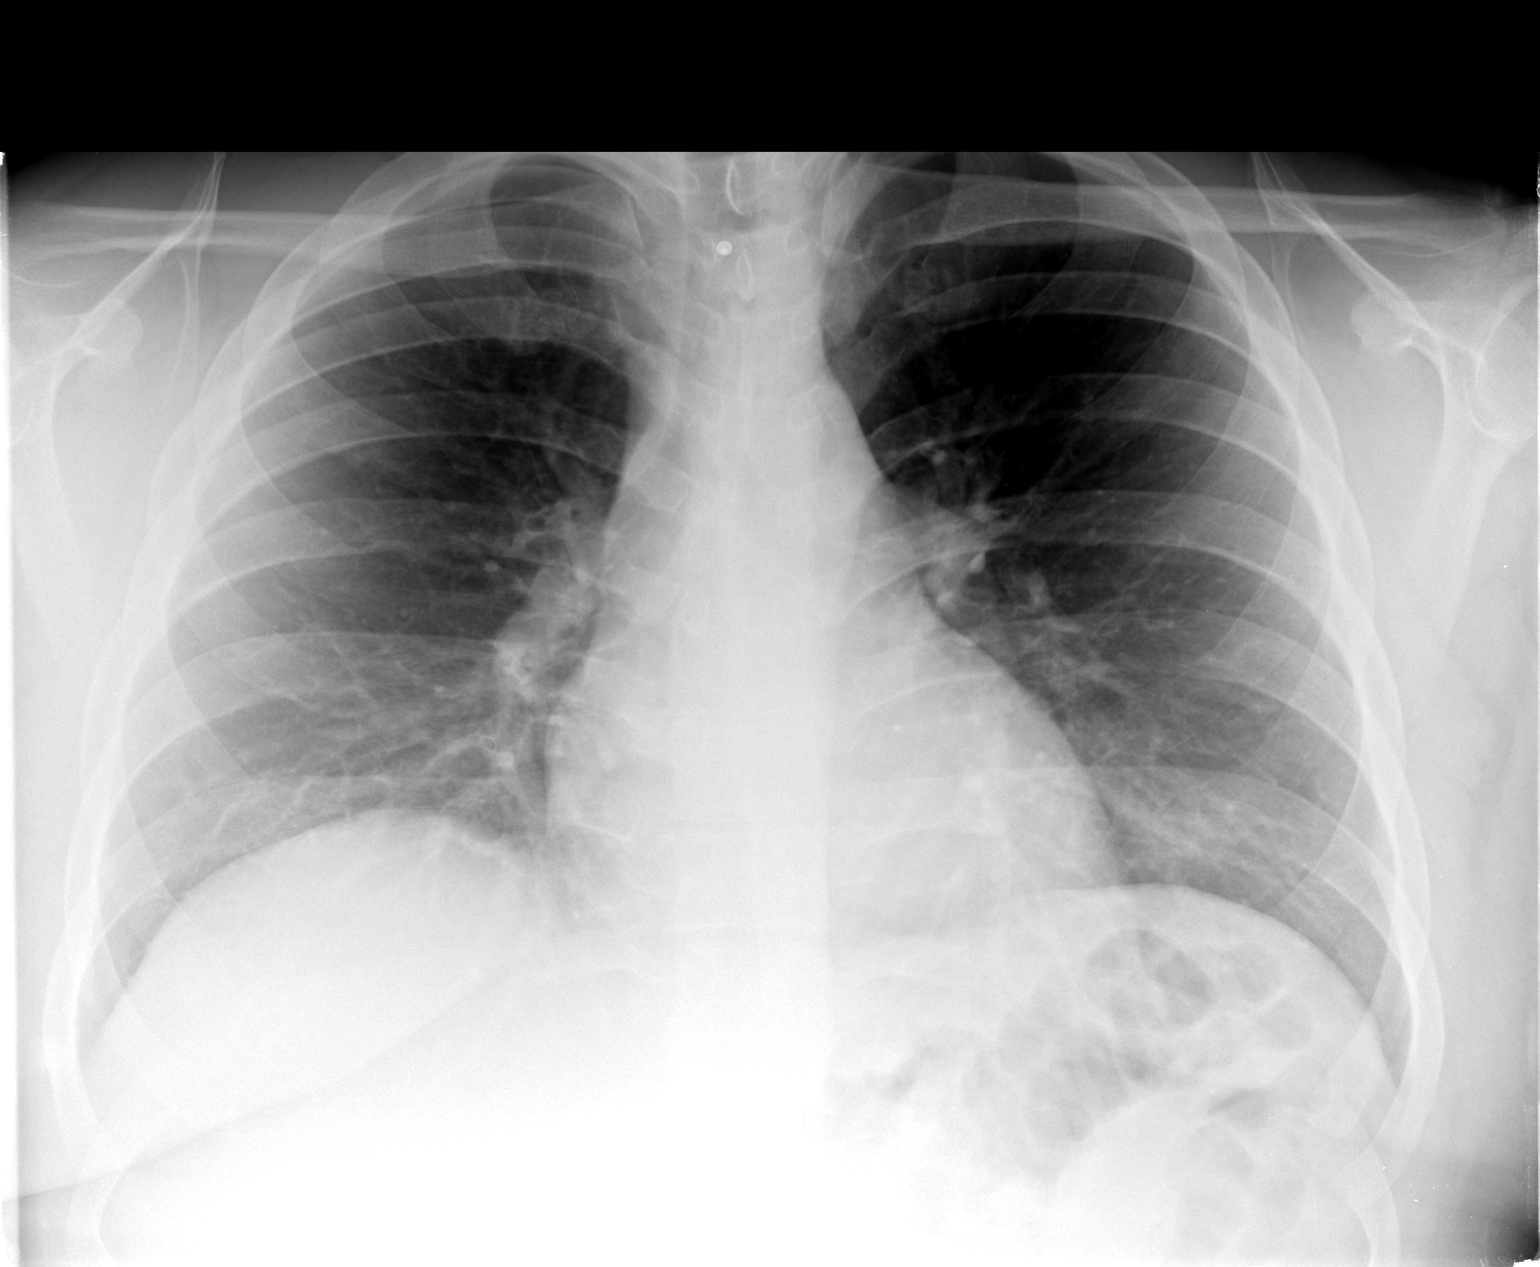

[view not recorded (2 of 2)]
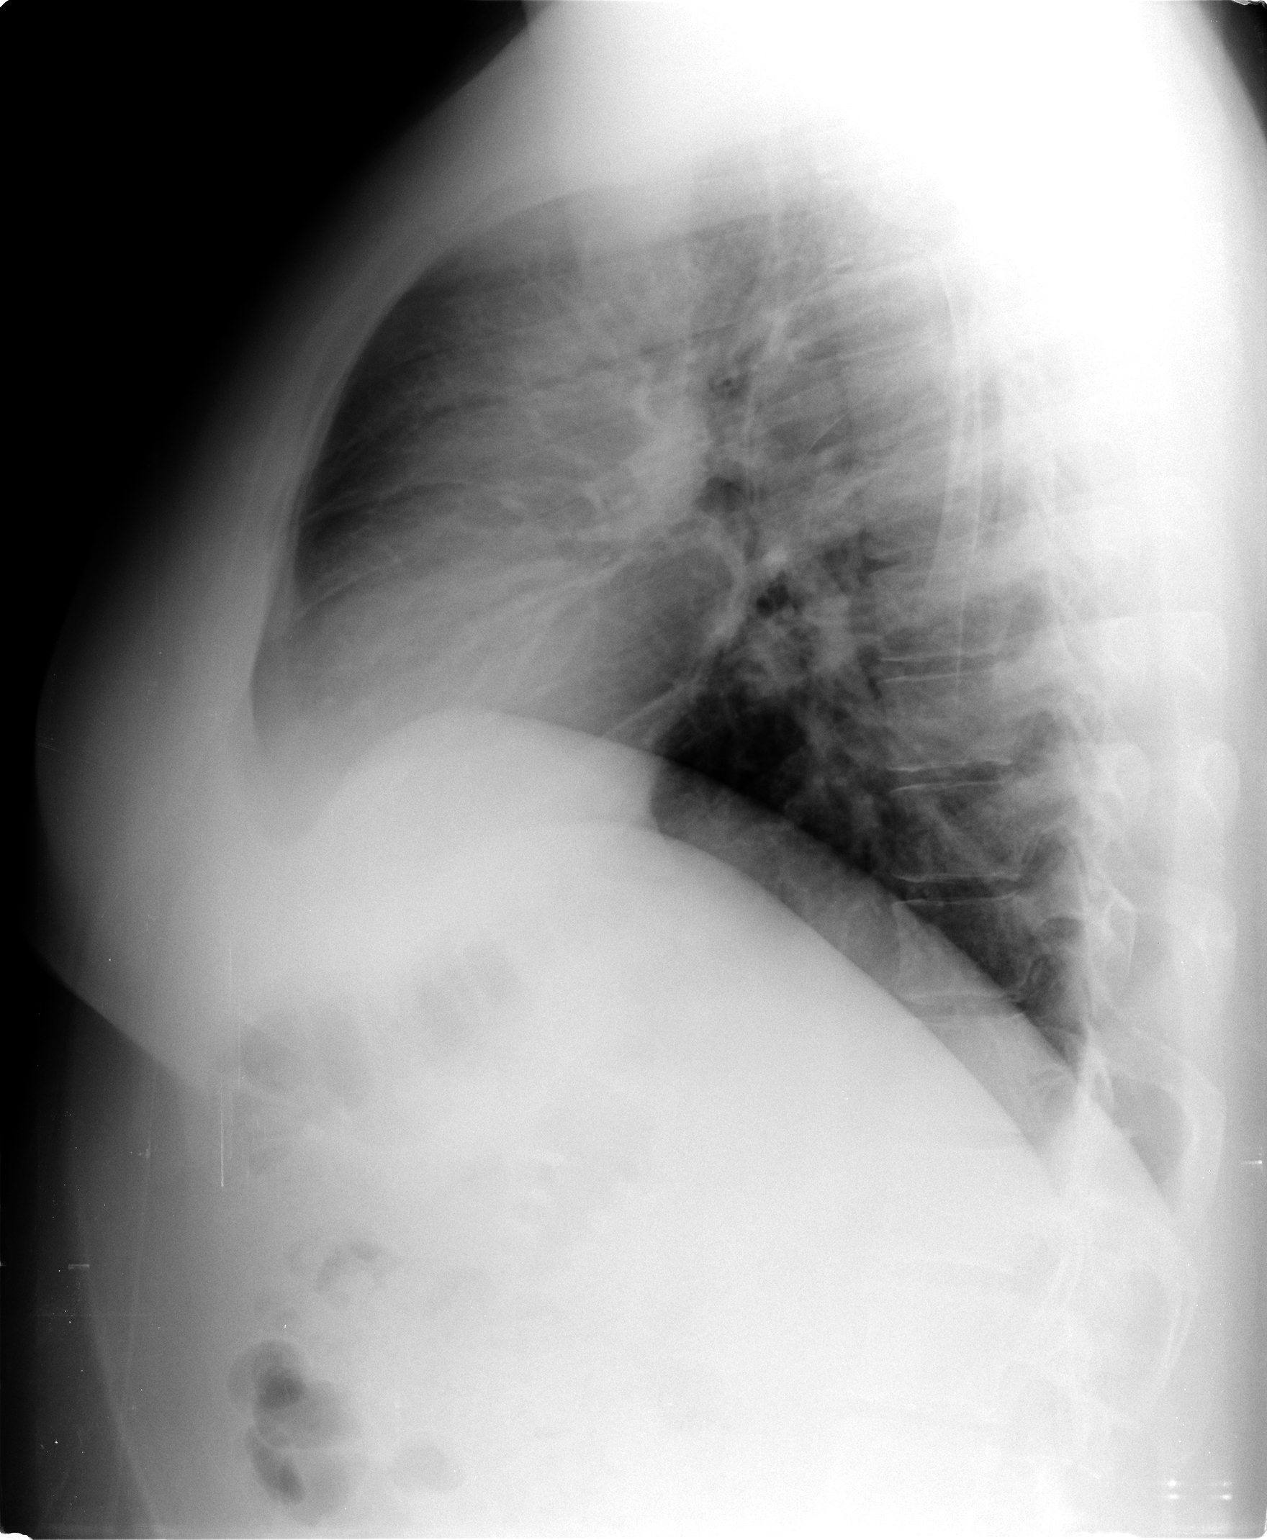

[2 of 2 positions shown; findings below may reference images not displayed]

FINDINGS: Cardiomediastinal silhouette is stable.  No acute
infiltrate or pleural effusion.  No pulmonary edema.  Mild
elevation of the right hemidiaphragm again noted.
IMPRESSION: No active disease.  No significant change.

## 2015-04-22 ENCOUNTER — Encounter (HOSPITAL_BASED_OUTPATIENT_CLINIC_OR_DEPARTMENT_OTHER): Payer: Self-pay | Admitting: *Deleted

## 2015-04-22 ENCOUNTER — Emergency Department (HOSPITAL_BASED_OUTPATIENT_CLINIC_OR_DEPARTMENT_OTHER)
Admission: EM | Admit: 2015-04-22 | Discharge: 2015-04-22 | Disposition: A | Discharge status: Home / Self Care | Payer: Managed Care, Other (non HMO) | Attending: Emergency Medicine | Admitting: Emergency Medicine

## 2015-04-22 DIAGNOSIS — K219 Gastro-esophageal reflux disease without esophagitis: Secondary | ICD-10-CM | POA: Diagnosis not present

## 2015-04-22 DIAGNOSIS — B356 Tinea cruris: Secondary | ICD-10-CM | POA: Insufficient documentation

## 2015-04-22 DIAGNOSIS — L309 Dermatitis, unspecified: Secondary | ICD-10-CM | POA: Diagnosis not present

## 2015-04-22 DIAGNOSIS — R21 Rash and other nonspecific skin eruption: Secondary | ICD-10-CM | POA: Diagnosis present

## 2015-04-22 MED ORDER — CLOTRIMAZOLE 1 % EX CREA: TOPICAL_CREAM | CUTANEOUS | Status: DC

## 2015-04-22 NOTE — ED Provider Notes (Signed)
CSN: [id number]     Arrival date & time 04/22/15  1721 History   First MD Initiated Contact with Patient 04/22/15 1737     Chief Complaint  Patient presents with  . Rash     (Consider location/radiation/quality/duration/timing/severity/associated sxs/prior Treatment) HPI Patient presents with itching rash in the intergluteal fold and under the pannus for the past week. No new exposure. Patient states he has a history of eczema but this is normally confined to his extremities and chest. No fever or chills. States he's been using hydrocortisone cream without improvement. Past Medical History  Diagnosis Date  . GERD (gastroesophageal reflux disease)    Past Surgical History  Procedure Laterality Date  . Arm surgery      fracture   Family History  Problem Relation Age of Onset  . Diabetes Paternal Uncle   . Hypertension Paternal Uncle   . Alcohol abuse Neg Hx   . Cancer Neg Hx   . Early death Neg Hx   . Heart disease Neg Hx   . Hyperlipidemia Neg Hx   . Kidney disease Neg Hx   . Stroke Neg Hx    Social History  Substance Use Topics  . Smoking status: Never Smoker   . Smokeless tobacco: Never Used  . Alcohol Use: 1.8 oz/week    3 Shots of liquor per week    Review of Systems  Constitutional: Negative for fever and chills.  Skin: Positive for rash.  All other systems reviewed and are negative.     Allergies  Review of patient's allergies indicates no known allergies.  Home Medications   Prior to Admission medications   Medication Sig Start Date End Date Taking? Authorizing Provider  cetirizine (ZYRTEC) 10 MG tablet Take 10 mg by mouth daily as needed for allergies.    Historical Provider, MD  clotrimazole (LOTRIMIN) 1 % cream Apply to affected area 2 times daily x 2 weeks 04/22/15   Loren Raceravid Axl Rodino, MD  famotidine (PEPCID) 10 MG tablet Take 10 mg by mouth 2 (two) times daily as needed for heartburn or indigestion.    Historical Provider, MD  hydrocortisone cream 1  % Apply 1 application topically 2 (two) times a week.    Historical Provider, MD   BP 119/90 mmHg  Pulse 104  Temp(Src) 98.6 F (37 C) (Oral)  Resp 16  Ht 5\' 9"  (1.753 m)  Wt 265 lb (120.203 kg)  BMI 39.12 kg/m2  SpO2 99% Physical Exam  Constitutional: He is oriented to person, place, and time. He appears well-developed and well-nourished. No distress.  HENT:  Head: Normocephalic and atraumatic.  Mouth/Throat: Oropharynx is clear and moist.  Eyes: EOM are normal. Pupils are equal, round, and reactive to light.  Neck: Normal range of motion. Neck supple.  Cardiovascular: Normal rate and regular rhythm.   Pulmonary/Chest: Effort normal and breath sounds normal.  Abdominal: Soft. Bowel sounds are normal. There is no tenderness.  Musculoskeletal: Normal range of motion. He exhibits no edema or tenderness.  Neurological: He is alert and oriented to person, place, and time.  Skin: Skin is warm and dry. Rash noted. No erythema.  Patient with a rash in the intertriginous areas in the intergluteal and infraabdominal folds. The rash is mildly erythematous with satellite lesions. There are areas that look thickened. There is no tenderness, fluctuance or warmth.  Psychiatric: He has a normal mood and affect. His behavior is normal.  Nursing note and vitals reviewed.   ED Course  Procedures (including critical  care time) Labs Review Labs Reviewed - No data to display  Imaging Review No results found. I have personally reviewed and evaluated these images and lab results as part of my medical decision-making.   EKG Interpretation None      MDM   Final diagnoses:  Eczema  Tinea cruris    Rash likely due to tinea cruris. This could be an exacerbation of his eczema as well. We'll give trial treatment with Lotrimin 2 weeks and have follow-up with dermatology. Advised to keep the area dry. Return precautions given.    Loren Racer, MD 04/22/15 [phone]

## 2015-04-22 NOTE — ED Notes (Signed)
Pt reports rash on his buttock x 1 week.  Itching.

## 2015-04-22 NOTE — Discharge Instructions (Signed)
Jock Itch  Jock itch (tinea cruris) is a fungal infection of the skin in the groin area. It is sometimes called ringworm, even though it is not caused by worms. It is caused by a fungus, which is a type of germ that thrives in dark, damp places. Jock itch causes a rash and itching in the groin and upper thigh area. It usually goes away in 2-3 weeks with treatment.  CAUSES  The fungus that causes jock itch may be spread by:  · Touching a fungus infection elsewhere on your body--such as athlete's foot--and then touching your groin area.  · Sharing towels or clothing with an infected person.  RISK FACTORS  Jock itch is most common in men and adolescent boys. This condition is more likely to develop from:  · Being in hot, humid climates.  · Wearing tight-fitting clothing or wet bathing suits for long periods of time.  · Participating in sports.  · Being overweight.  · Having diabetes.  SYMPTOMS  Symptoms of jock itch may include:  · A red, pink, or brown rash in the groin area. The rash may spread to the thighs, anus, and buttocks.  · Dry and scaly skin on or around the rash.  · Itchiness.  DIAGNOSIS  Most often, a health care provider can make the diagnosis by looking at your rash. Sometimes, a scraping of the infected skin will be taken. This sample may be tested by looking at it under a microscope or by trying to grow the fungus from the sample (culture).   TREATMENT  Treatment for this condition may include:  · Antifungal medicine to kill the fungus. This may be in various forms:    Skin cream or ointment.    Medicine taken by mouth.  · Skin cream or ointment to reduce the itching.  · Compresses or medicated powders to dry the infected skin.  HOME CARE INSTRUCTIONS  · Take medicines only as directed by your health care provider. Apply skin creams or ointments exactly as directed.  · Wear loose-fitting clothing.    Men should wear cotton boxer shorts.    Women should wear cotton underwear.  · Change your underwear  every day to keep your groin dry.  · Avoid hot baths.  · Dry your groin area well after bathing.    Use a separate towel to dry your groin area. This will help to prevent a spreading of the infection to other areas of your body.  · Do not scratch the affected area.  · Do not share towels with other people.  SEEK MEDICAL CARE IF:  · Your rash does not improve or it gets worse after 2 weeks of treatment.  · Your rash is spreading.  · Your rash returns after treatment is finished.  · You have a fever.  · You have redness, swelling, or pain in the area around your rash.  · You have fluid, blood, or pus coming from your rash.  · Your have your rash for more than 4 weeks.     This information is not intended to replace advice given to you by your health care provider. Make sure you discuss any questions you have with your health care provider.     Document Released: 01/05/2002 Document Revised: 02/05/2014 Document Reviewed: 10/27/2013  Elsevier Interactive Patient Education ©2016 [employer]

## 2015-05-05 ENCOUNTER — Ambulatory Visit (INDEPENDENT_AMBULATORY_CARE_PROVIDER_SITE_OTHER): Payer: Managed Care, Other (non HMO) | Admitting: Family Medicine

## 2015-05-05 VITALS — BP 122/74 | HR 103 | Temp 98.1°F | Resp 18 | Ht 69.5 in | Wt 264.2 lb

## 2015-05-05 DIAGNOSIS — K644 Residual hemorrhoidal skin tags: Secondary | ICD-10-CM

## 2015-05-05 DIAGNOSIS — K5900 Constipation, unspecified: Secondary | ICD-10-CM

## 2015-05-05 MED ORDER — HYDROCORTISONE 2.5 % RE CREA: 1.0000 "application " | TOPICAL_CREAM | Freq: Two times a day (BID) | RECTAL | Status: DC

## 2015-05-05 NOTE — Patient Instructions (Addendum)
IF you received an x-ray today, you will receive an invoice from [facility]. Please contact [facility] at [phone] with questions or concerns regarding your invoice.   IF you received labwork today, you will receive an invoice from [facility]. Please contact [facility] at [phone] with questions or concerns regarding your invoice.   Our billing staff will not be able to assist you with questions regarding bills from these companies.  You will be contacted with the lab results as soon as they are available. The fastest way to get your results is to activate your My Chart account. Instructions are located on the last page of this paperwork. If you have not heard from us regarding the results in 2 weeks, please contact this office.    You can try the Anusol HC cream as needed for itching or irritation to the area, warm soaks and follow-up with gastroenterology as planned. These areas appear to be either irritated skin tags, or possible hemorrhoids. If any increased swelling or pain prior to seeing  gastroenterology, return here for recheck.  Drink plenty of fluids, and fiber in the diet to lessen chance of constipation returning.  Return to the clinic or go to the nearest emergency room if any of your symptoms worsen or new symptoms occur.  Constipation, Adult Constipation is when a person has fewer than three bowel movements a week, has difficulty having a bowel movement, or has stools that are dry, hard, or larger than normal. As people grow older, constipation is more common. A low-fiber diet, not taking in enough fluids, and taking certain medicines may make constipation worse.  CAUSES   Certain medicines, such as antidepressants, pain medicine, iron supplements, antacids, and water pills.   Certain diseases, such as diabetes, irritable bowel syndrome (IBS), thyroid disease, or depression.   Not drinking enough water.   Not  eating enough fiber-rich foods.   Stress or travel.   Lack of physical activity or exercise.   Ignoring the urge to have a bowel movement.   Using laxatives too much.  SIGNS AND SYMPTOMS   Having fewer than three bowel movements a week.   Straining to have a bowel movement.   Having stools that are hard, dry, or larger than normal.   Feeling full or bloated.   Pain in the lower abdomen.   Not feeling relief after having a bowel movement.  DIAGNOSIS  Your health care provider will take a medical history and perform a physical exam. Further testing may be done for severe constipation. Some tests may include:  A barium enema X-ray to examine your rectum, colon, and, sometimes, your small intestine.   A sigmoidoscopy to examine your lower colon.   A colonoscopy to examine your entire colon. TREATMENT  Treatment will depend on the severity of your constipation and what is causing it. Some dietary treatments include drinking more fluids and eating more fiber-rich foods. Lifestyle treatments may include regular exercise. If these diet and lifestyle recommendations do not help, your health care provider may recommend taking over-the-counter laxative medicines to help you have bowel movements. Prescription medicines may be prescribed if over-the-counter medicines do not work.  HOME CARE INSTRUCTIONS   Eat foods that have a lot of fiber, such as fruits, vegetables, whole grains, and beans.  Limit foods high in fat and processed sugars, such as french fries, hamburgers, cookies, candies, and soda.   A fiber supplement may be added to your diet if you  cannot get enough fiber from foods.   Drink enough fluids to keep your urine clear or pale yellow.   Exercise regularly or as directed by your health care provider.   Go to the restroom when you have the urge to go. Do not hold it.   Only take over-the-counter or prescription medicines as directed by your health care  provider. Do not take other medicines for constipation without talking to your health care provider first.  SEEK IMMEDIATE MEDICAL CARE IF:   You have bright red blood in your stool.   Your constipation lasts for more than 4 days or gets worse.   You have abdominal or rectal pain.   You have thin, pencil-like stools.   You have unexplained weight loss. MAKE SURE YOU:   Understand these instructions.  Will watch your condition.  Will get help right away if you are not doing well or get worse.   This information is not intended to replace advice given to you by your health care provider. Make sure you discuss any questions you have with your health care provider.   Document Released: 10/14/2003 Document Revised: 02/05/2014 Document Reviewed: 10/27/2012 Elsevier Interactive Patient Education 2016 [employer]. Hemorrhoids Hemorrhoids are swollen veins around the rectum or anus. There are two types of hemorrhoids:   Internal hemorrhoids. These occur in the veins just inside the rectum. They may poke through to the outside and become irritated and painful.  External hemorrhoids. These occur in the veins outside the anus and can be felt as a painful swelling or hard lump near the anus. CAUSES  Pregnancy.   Obesity.   Constipation or diarrhea.   Straining to have a bowel movement.   Sitting for long periods on the toilet.  Heavy lifting or other activity that caused you to strain.  Anal intercourse. SYMPTOMS   Pain.   Anal itching or irritation.   Rectal bleeding.   Fecal leakage.   Anal swelling.   One or more lumps around the anus.  DIAGNOSIS  Your caregiver may be able to diagnose hemorrhoids by visual examination. Other examinations or tests that may be performed include:   Examination of the rectal area with a gloved hand (digital rectal exam).   Examination of anal canal using a small tube (scope).   A blood test if you have lost a  significant amount of blood.  A test to look inside the colon (sigmoidoscopy or colonoscopy). TREATMENT Most hemorrhoids can be treated at home. However, if symptoms do not seem to be getting better or if you have a lot of rectal bleeding, your caregiver may perform a procedure to help make the hemorrhoids get smaller or remove them completely. Possible treatments include:   Placing a rubber band at the base of the hemorrhoid to cut off the circulation (rubber band ligation).   Injecting a chemical to shrink the hemorrhoid (sclerotherapy).   Using a tool to burn the hemorrhoid (infrared light therapy).   Surgically removing the hemorrhoid (hemorrhoidectomy).   Stapling the hemorrhoid to block blood flow to the tissue (hemorrhoid stapling).  HOME CARE INSTRUCTIONS   Eat foods with fiber, such as whole grains, beans, nuts, fruits, and vegetables. Ask your doctor about taking products with added fiber in them (fibersupplements).  Increase fluid intake. Drink enough water and fluids to keep your urine clear or pale yellow.   Exercise regularly.   Go to the bathroom when you have the urge to have a bowel movement. Do not  wait.   Avoid straining to have bowel movements.   Keep the anal area dry and clean. Use wet toilet paper or moist towelettes after a bowel movement.   Medicated creams and suppositories may be used or applied as directed.   Only take over-the-counter or prescription medicines as directed by your caregiver.   Take warm sitz baths for 15-20 minutes, 3-4 times a day to ease pain and discomfort.   Place ice packs on the hemorrhoids if they are tender and swollen. Using ice packs between sitz baths may be helpful.   Put ice in a plastic bag.   Place a towel between your skin and the bag.   Leave the ice on for 15-20 minutes, 3-4 times a day.   Do not use a donut-shaped pillow or sit on the toilet for long periods. This increases blood pooling and  pain.  SEEK MEDICAL CARE IF:  You have increasing pain and swelling that is not controlled by treatment or medicine.  You have uncontrolled bleeding.  You have difficulty or you are unable to have a bowel movement.  You have pain or inflammation outside the area of the hemorrhoids. MAKE SURE YOU:  Understand these instructions.  Will watch your condition.  Will get help right away if you are not doing well or get worse.   This information is not intended to replace advice given to you by your health care provider. Make sure you discuss any questions you have with your health care provider.   Document Released: 01/13/2000 Document Revised: 01/02/2012 Document Reviewed: 11/20/2011 Elsevier Interactive Patient Education [employer]

## 2015-05-05 NOTE — Progress Notes (Signed)
Subjective:  By signing my name below, I, Stann Ore, attest that this documentation has been prepared under the direction and in the presence of Meredith Staggers, MD. Electronically Signed: Stann Ore, Scribe. 05/05/2015 , 12:03 PM .  Patient was seen in Room 13 .   Patient ID: Trevor Brewer, male    DOB: August 24, 1990, 25 y.o.   MRN: [id number] Chief Complaint  Patient presents with  . Growth    Located on anus, x1 week   HPI Trevor Brewer is a 25 y.o. male  Patient mentions noticing a small bump around his anus over a year. Recently, he's noticed the bump becoming more protruded and more painful over the last week. He's been applying preparation-H over the area for some relief and has become less painful. He's had some white mucus and blood discharge from the area. When he passes a bowel movement, it causes more pain in the area. Prior to swelling over the last week, he's had some constipation and felt more bloated in his abdomen.   He was seen in the ED 1 week ago for rash. He was informed it was just eczema.   He denies recent sexual activity. He's sexually involved with both males and females. He denies any recent anal intercourse. He notes his last receptive anal intercourse was 3-4 months ago. He denies STD's in the past. He denies any genital warts.   He has an appointment with GI on 05/24/15.   Patient Active Problem List   Diagnosis Date Noted  . Chest pain 06/19/2012  . Tachycardia 06/19/2012  . Other abnormal glucose 02/22/2012  . Routine general medical examination at a health care facility 02/22/2012  . GERD (gastroesophageal reflux disease) 02/22/2012   Past Medical History  Diagnosis Date  . GERD (gastroesophageal reflux disease)    Past Surgical History  Procedure Laterality Date  . Arm surgery      fracture   No Known Allergies Prior to Admission medications   Medication Sig Start Date End Date Taking? Authorizing Provider  cetirizine (ZYRTEC) 10 MG  tablet Take 10 mg by mouth daily as needed for allergies.   Yes Historical Provider, MD  famotidine (PEPCID) 10 MG tablet Take 10 mg by mouth 2 (two) times daily as needed for heartburn or indigestion.   Yes Historical Provider, MD  clotrimazole (LOTRIMIN) 1 % cream Apply to affected area 2 times daily x 2 weeks Patient not taking: Reported on 05/05/2015 04/22/15   Loren Racer, MD  hydrocortisone cream 1 % Apply 1 application topically 2 (two) times a week. Reported on 05/05/2015    Historical Provider, MD   Social History   Social History  . Marital Status: Single    Spouse Name: N/A  . Number of Children: N/A  . Years of Education: N/A   Occupational History  .      Works at [employer]   Social History Main Topics  . Smoking status: Never Smoker   . Smokeless tobacco: Never Used  . Alcohol Use: 1.8 oz/week    3 Shots of liquor per week  . Drug Use: No  . Sexual Activity: No   Other Topics Concern  . Not on file   Social History Narrative   Archivist: Senior at [employer]   Regular exercise: 3 times weekly   Caffeine use: daily, energy drinks   Review of Systems  Constitutional: Negative for fever, chills and fatigue.  Gastrointestinal: Positive for anal bleeding. Negative for nausea, vomiting, abdominal pain,  diarrhea and blood in stool.  Genitourinary: Negative for dysuria, discharge and genital sores.  Skin: Negative for rash and wound.       Objective:   Physical Exam  Constitutional: He is oriented to person, place, and time. He appears well-developed and well-nourished. No distress.  HENT:  Head: Normocephalic and atraumatic.  Eyes: EOM are normal. Pupils are equal, round, and reactive to light.  Neck: Neck supple.  Cardiovascular: Normal rate.   Pulmonary/Chest: Effort normal. No respiratory distress.  Genitourinary:  approximately 6 o'clock, there's 2 skin colored projections with slight amount dried blood at inferior aspect, no anal fissures, no active  bleeding  Musculoskeletal: Normal range of motion.  Neurological: He is alert and oriented to person, place, and time.  Skin: Skin is warm and dry.  Psychiatric: He has a normal mood and affect. His behavior is normal.  Nursing note and vitals reviewed.   Filed Vitals:   05/05/15 1052  BP: 122/74  Pulse: 103  Temp: 98.1 F (36.7 C)  TempSrc: Oral  Resp: 18  Height: 5' 9.5" (1.765 m)  Weight: 264 lb 3.2 oz (119.84 kg)  SpO2: 99%      Assessment & Plan:   Trevor Brewer is a 25 y.o. male Constipation, unspecified constipation type  Hemorrhoidal skin tags - Plan: hydrocortisone (ANUSOL-HC) 2.5 % rectal cream  Hemorrhoidal skin tag versus external hemorrhoids with flare since constipation episode to reschedule. Does not appear thrombosed at this time, and does have appearance more of irritated external skin tag.  -  Can try Anusol HC as needed up to 2 weeks, follow-up with gastroenterology as planned. Fiber and fluids and diet discussed for constipation prevention, RTC precautions.  Meds ordered this encounter  Medications  . hydrocortisone (ANUSOL-HC) 2.5 % rectal cream    Sig: Place 1 application rectally 2 (two) times daily.    Dispense:  30 g    Refill:  0   Patient Instructions       IF you received an x-ray today, you will receive an invoice from [facility]. Please contact [facility] at [phone] with questions or concerns regarding your invoice.   IF you received labwork today, you will receive an invoice from [facility]. Please contact [facility] at [phone] with questions or concerns regarding your invoice.   Our billing staff will not be able to assist you with questions regarding bills from these companies.  You will be contacted with the lab results as soon as they are available. The fastest way to get your results is to activate your My Chart account. Instructions are located on the last page of this  paperwork. If you have not heard from [place] regarding the results in 2 weeks, please contact this office.    You can try the Anusol HC cream as needed for itching or irritation to the area, warm soaks and follow-up with gastroenterology as planned. These areas appear to be either irritated skin tags, or possible hemorrhoids. If any increased swelling or pain prior to seeing  gastroenterology, return here for recheck.  Drink plenty of fluids, and fiber in the diet to lessen chance of constipation returning.  Return to the clinic or go to the nearest emergency room if any of your symptoms worsen or new symptoms occur.  Constipation, Adult Constipation is when a person has fewer than three bowel movements a week, has difficulty having a bowel movement, or has stools that are dry, hard, or larger than normal. As people  grow older, constipation is more common. A low-fiber diet, not taking in enough fluids, and taking certain medicines may make constipation worse.  CAUSES   Certain medicines, such as antidepressants, pain medicine, iron supplements, antacids, and water pills.   Certain diseases, such as diabetes, irritable bowel syndrome (IBS), thyroid disease, or depression.   Not drinking enough water.   Not eating enough fiber-rich foods.   Stress or travel.   Lack of physical activity or exercise.   Ignoring the urge to have a bowel movement.   Using laxatives too much.  SIGNS AND SYMPTOMS   Having fewer than three bowel movements a week.   Straining to have a bowel movement.   Having stools that are hard, dry, or larger than normal.   Feeling full or bloated.   Pain in the lower abdomen.   Not feeling relief after having a bowel movement.  DIAGNOSIS  Your health care provider will take a medical history and perform a physical exam. Further testing may be done for severe constipation. Some tests may include:  A barium enema X-ray to examine your rectum, colon,  and, sometimes, your small intestine.   A sigmoidoscopy to examine your lower colon.   A colonoscopy to examine your entire colon. TREATMENT  Treatment will depend on the severity of your constipation and what is causing it. Some dietary treatments include drinking more fluids and eating more fiber-rich foods. Lifestyle treatments may include regular exercise. If these diet and lifestyle recommendations do not help, your health care provider may recommend taking over-the-counter laxative medicines to help you have bowel movements. Prescription medicines may be prescribed if over-the-counter medicines do not work.  HOME CARE INSTRUCTIONS   Eat foods that have a lot of fiber, such as fruits, vegetables, whole grains, and beans.  Limit foods high in fat and processed sugars, such as french fries, hamburgers, cookies, candies, and soda.   A fiber supplement may be added to your diet if you cannot get enough fiber from foods.   Drink enough fluids to keep your urine clear or pale yellow.   Exercise regularly or as directed by your health care provider.   Go to the restroom when you have the urge to go. Do not hold it.   Only take over-the-counter or prescription medicines as directed by your health care provider. Do not take other medicines for constipation without talking to your health care provider first.  SEEK IMMEDIATE MEDICAL CARE IF:   You have bright red blood in your stool.   Your constipation lasts for more than 4 days or gets worse.   You have abdominal or rectal pain.   You have thin, pencil-like stools.   You have unexplained weight loss. MAKE SURE YOU:   Understand these instructions.  Will watch your condition.  Will get help right away if you are not doing well or get worse.   This information is not intended to replace advice given to you by your health care provider. Make sure you discuss any questions you have with your health care provider.     Document Released: 10/14/2003 Document Revised: 02/05/2014 Document Reviewed: 10/27/2012 Elsevier Interactive Patient Education 2016 [employer]. Hemorrhoids Hemorrhoids are swollen veins around the rectum or anus. There are two types of hemorrhoids:   Internal hemorrhoids. These occur in the veins just inside the rectum. They may poke through to the outside and become irritated and painful.  External hemorrhoids. These occur in the veins outside the anus and  can be felt as a painful swelling or hard lump near the anus. CAUSES  Pregnancy.   Obesity.   Constipation or diarrhea.   Straining to have a bowel movement.   Sitting for long periods on the toilet.  Heavy lifting or other activity that caused you to strain.  Anal intercourse. SYMPTOMS   Pain.   Anal itching or irritation.   Rectal bleeding.   Fecal leakage.   Anal swelling.   One or more lumps around the anus.  DIAGNOSIS  Your caregiver may be able to diagnose hemorrhoids by visual examination. Other examinations or tests that may be performed include:   Examination of the rectal area with a gloved hand (digital rectal exam).   Examination of anal canal using a small tube (scope).   A blood test if you have lost a significant amount of blood.  A test to look inside the colon (sigmoidoscopy or colonoscopy). TREATMENT Most hemorrhoids can be treated at home. However, if symptoms do not seem to be getting better or if you have a lot of rectal bleeding, your caregiver may perform a procedure to help make the hemorrhoids get smaller or remove them completely. Possible treatments include:   Placing a rubber band at the base of the hemorrhoid to cut off the circulation (rubber band ligation).   Injecting a chemical to shrink the hemorrhoid (sclerotherapy).   Using a tool to burn the hemorrhoid (infrared light therapy).   Surgically removing the hemorrhoid (hemorrhoidectomy).   Stapling the  hemorrhoid to block blood flow to the tissue (hemorrhoid stapling).  HOME CARE INSTRUCTIONS   Eat foods with fiber, such as whole grains, beans, nuts, fruits, and vegetables. Ask your doctor about taking products with added fiber in them (fibersupplements).  Increase fluid intake. Drink enough water and fluids to keep your urine clear or pale yellow.   Exercise regularly.   Go to the bathroom when you have the urge to have a bowel movement. Do not wait.   Avoid straining to have bowel movements.   Keep the anal area dry and clean. Use wet toilet paper or moist towelettes after a bowel movement.   Medicated creams and suppositories may be used or applied as directed.   Only take over-the-counter or prescription medicines as directed by your caregiver.   Take warm sitz baths for 15-20 minutes, 3-4 times a day to ease pain and discomfort.   Place ice packs on the hemorrhoids if they are tender and swollen. Using ice packs between sitz baths may be helpful.   Put ice in a plastic bag.   Place a towel between your skin and the bag.   Leave the ice on for 15-20 minutes, 3-4 times a day.   Do not use a donut-shaped pillow or sit on the toilet for long periods. This increases blood pooling and pain.  SEEK MEDICAL CARE IF:  You have increasing pain and swelling that is not controlled by treatment or medicine.  You have uncontrolled bleeding.  You have difficulty or you are unable to have a bowel movement.  You have pain or inflammation outside the area of the hemorrhoids. MAKE SURE YOU:  Understand these instructions.  Will watch your condition.  Will get help right away if you are not doing well or get worse.   This information is not intended to replace advice given to you by your health care provider. Make sure you discuss any questions you have with your health care provider.  Document Released: 01/13/2000 Document Revised: 01/02/2012 Document Reviewed:  11/20/2011 Elsevier Interactive Patient Education [employer]      I personally performed the services described in this documentation, which was scribed in my presence. The recorded information has been reviewed and considered, and addended by me as needed.

## 2015-09-02 ENCOUNTER — Encounter (HOSPITAL_COMMUNITY): Payer: Self-pay | Admitting: *Deleted

## 2015-09-02 ENCOUNTER — Emergency Department (HOSPITAL_COMMUNITY)
Admission: EM | Admit: 2015-09-02 | Discharge: 2015-09-02 | Disposition: A | Discharge status: Home / Self Care | Payer: Managed Care, Other (non HMO) | Attending: Emergency Medicine | Admitting: Emergency Medicine

## 2015-09-02 ENCOUNTER — Emergency Department (HOSPITAL_COMMUNITY): Payer: Managed Care, Other (non HMO)

## 2015-09-02 DIAGNOSIS — Y929 Unspecified place or not applicable: Secondary | ICD-10-CM | POA: Diagnosis not present

## 2015-09-02 DIAGNOSIS — S99911A Unspecified injury of right ankle, initial encounter: Secondary | ICD-10-CM | POA: Diagnosis present

## 2015-09-02 DIAGNOSIS — X509XXA Other and unspecified overexertion or strenuous movements or postures, initial encounter: Secondary | ICD-10-CM | POA: Diagnosis not present

## 2015-09-02 DIAGNOSIS — S93401A Sprain of unspecified ligament of right ankle, initial encounter: Secondary | ICD-10-CM | POA: Insufficient documentation

## 2015-09-02 DIAGNOSIS — Y999 Unspecified external cause status: Secondary | ICD-10-CM | POA: Diagnosis not present

## 2015-09-02 DIAGNOSIS — Y9301 Activity, walking, marching and hiking: Secondary | ICD-10-CM | POA: Diagnosis not present

## 2015-09-02 MED ORDER — IBUPROFEN 400 MG PO TABS
800.0000 mg | ORAL_TABLET | Freq: Once | ORAL | Status: AC
  Administered 2015-09-02: 800 mg via ORAL
  Filled 2015-09-02: qty 2

## 2015-09-02 NOTE — ED Triage Notes (Signed)
Pt c/o R foot pain onset today when he missed a step, pt reports external rotation, +PMS, pt has swelling to lateral R ankle, A&O x4

## 2015-09-02 NOTE — Discharge Instructions (Signed)
You have an ankle sprain. Ice and elevated the foot over the next 48 hours. You may apply weight as you are able. Use the brace while you are walking and upright. Follow up with the orthopedist.

## 2015-09-02 NOTE — ED Notes (Signed)
Ortho tech called 

## 2015-09-02 NOTE — ED Notes (Signed)
Ortho tech in. 

## 2015-09-02 NOTE — Progress Notes (Signed)
Orthopedic Tech Progress Note Patient Details:  Trevor Brewer July 09, 1990 [id number]  Ortho Devices Type of Ortho Device: ASO, Crutches Ortho Device/Splint Interventions: Application   [name] 09/02/2015, 11:14 AM

## 2015-09-02 NOTE — ED Provider Notes (Signed)
[facility]-EMERGENCY DEPT Provider Note   CSN: [id number] Arrival date & time: 09/02/15  1017  First Provider Contact:   First MD Initiated Contact with Patient 09/02/15 1036     By signing my name below, I, Trevor Brewer, attest that this documentation has been prepared under the direction and in the presence of Arthor Captain, PA-C Electronically Signed: Charline Bills, ED Scribe 09/02/2015 at 10:46 AM.   History   Chief Complaint Chief Complaint  Patient presents with  . Ankle Pain    HPI Trevor Brewer is a 25 y.o. male who presents to the Emergency Department complaining of a right ankle injury that occurred around 7:30 AM today. Pt states that he was walking down stairs this morning when he missed a step and rolled his right ankle. He reports immediate pain and swelling to the right ankle. Pt states that he was unable to bear weight on his right ankle for a few moments following the incident. No treatments tried PTA.   The history is provided by the patient. No language interpreter was used.    Past Medical History:  Diagnosis Date  . GERD (gastroesophageal reflux disease)     Patient Active Problem List   Diagnosis Date Noted  . Chest pain 06/19/2012  . Tachycardia 06/19/2012  . Other abnormal glucose 02/22/2012  . Routine general medical examination at a health care facility 02/22/2012  . GERD (gastroesophageal reflux disease) 02/22/2012    Past Surgical History:  Procedure Laterality Date  . arm surgery     fracture    Home Medications    Prior to Admission medications   Medication Sig Start Date End Date Taking? Authorizing Provider  cetirizine (ZYRTEC) 10 MG tablet Take 10 mg by mouth daily as needed for allergies.    Historical Provider, MD  famotidine (PEPCID) 10 MG tablet Take 10 mg by mouth 2 (two) times daily as needed for heartburn or indigestion.    Historical Provider, MD  hydrocortisone (ANUSOL-HC) 2.5 % rectal cream Place 1 application rectally 2 (two)  times daily. 05/05/15   Shade Flood, MD  hydrocortisone cream 1 % Apply 1 application topically 2 (two) times a week. Reported on 05/05/2015    Historical Provider, MD    Family History Family History  Problem Relation Age of Onset  . Diabetes Paternal Uncle   . Hypertension Paternal Uncle   . Alcohol abuse Neg Hx   . Cancer Neg Hx   . Early death Neg Hx   . Heart disease Neg Hx   . Hyperlipidemia Neg Hx   . Kidney disease Neg Hx   . Stroke Neg Hx     Social History Social History  Substance Use Topics  . Smoking status: Never Smoker  . Smokeless tobacco: Never Used  . Alcohol use 1.8 oz/week    3 Shots of liquor per week     Allergies   Review of patient's allergies indicates no known allergies.   Review of Systems Review of Systems  Musculoskeletal: Positive for arthralgias and joint swelling.    Physical Exam Updated Vital Signs BP 135/81 (BP Location: Right Arm)   Pulse 96   Temp 98.3 F (36.8 C) (Oral)   Resp 18   Wt 267 lb 14.4 oz (121.5 kg)   SpO2 99%   BMI 38.99 kg/m   Physical Exam  Constitutional: He is oriented to person, place, and time. He appears well-developed and well-nourished. No distress.  HENT:  Head: Normocephalic and atraumatic.  Eyes:  Conjunctivae and EOM are normal.  Neck: Neck supple. No tracheal deviation present.  Cardiovascular: Normal rate.   Pulmonary/Chest: Effort normal. No respiratory distress.  Musculoskeletal: Normal range of motion.  R ankle swelling more significantly on the lateral side Tenderness to palpation on the lateral malleolus  Limited ROM of the ankle due to pain NVI  Neurological: He is alert and oriented to person, place, and time.  Skin: Skin is warm and dry.  Psychiatric: He has a normal mood and affect. His behavior is normal.  Nursing note and vitals reviewed.   ED Treatments / Results  Labs (all labs ordered are listed, but only abnormal results are displayed) Labs Reviewed - No data to  display  EKG  EKG Interpretation None       Radiology No results found.  Procedures Procedures (including critical care time) DIAGNOSTIC STUDIES: Oxygen Saturation is 99% on RA, normal by my interpretation.    COORDINATION OF CARE: 10:38 AM-Discussed treatment plan which includes XR with pt at bedside and pt agreed to plan.   Medications Ordered in ED Medications - No data to display   Initial Impression / Assessment and Plan / ED Course  I have reviewed the triage vital signs and the nursing notes.  Pertinent labs & imaging results that were available during my care of the patient were reviewed by me and considered in my medical decision making (see chart for details).  Clinical Course   I personally performed the services described in this documentation, which was scribed in my presence. The recorded information has been reviewed and is accurate.     Final Clinical Impressions(s) / ED Diagnoses   Final diagnoses:  Ankle sprain, right, initial encounter   Patient X-Ray negative for obvious fracture or dislocation. Pain managed in ED. Pt advised to follow up with orthopedics if symptoms persist for possibility of missed fracture diagnosis. Patient given brace while in ED, conservative therapy recommended and discussed. Patient will be dc home & is agreeable with above plan.   New Prescriptions New Prescriptions   No medications on file     Arthor Captain, PA-C 09/02/15 1115    Shaune Pollack, MD 09/02/15 [phone]

## 2015-09-02 NOTE — ED Notes (Signed)
Moved to stretcher, right ankle elevated and iced.

## 2016-01-04 ENCOUNTER — Ambulatory Visit: Payer: Managed Care, Other (non HMO) | Admitting: Family Medicine

## 2016-02-08 ENCOUNTER — Ambulatory Visit (INDEPENDENT_AMBULATORY_CARE_PROVIDER_SITE_OTHER): Payer: Self-pay | Admitting: Family Medicine

## 2016-02-08 DIAGNOSIS — Z91199 Patient's noncompliance with other medical treatment and regimen due to unspecified reason: Secondary | ICD-10-CM | POA: Insufficient documentation

## 2016-02-08 DIAGNOSIS — Z5329 Procedure and treatment not carried out because of patient's decision for other reasons: Secondary | ICD-10-CM

## 2016-02-08 NOTE — Progress Notes (Signed)
Patient was a no-show 

## 2016-04-26 ENCOUNTER — Ambulatory Visit (INDEPENDENT_AMBULATORY_CARE_PROVIDER_SITE_OTHER): Payer: Managed Care, Other (non HMO) | Admitting: Emergency Medicine

## 2016-04-26 VITALS — BP 126/78 | HR 91 | Temp 98.4°F | Resp 18 | Ht 69.5 in | Wt 261.6 lb

## 2016-04-26 DIAGNOSIS — A4902 Methicillin resistant Staphylococcus aureus infection, unspecified site: Secondary | ICD-10-CM | POA: Insufficient documentation

## 2016-04-26 DIAGNOSIS — M009 Pyogenic arthritis, unspecified: Secondary | ICD-10-CM

## 2016-04-26 DIAGNOSIS — L089 Local infection of the skin and subcutaneous tissue, unspecified: Secondary | ICD-10-CM

## 2016-04-26 MED ORDER — DOXYCYCLINE HYCLATE 100 MG PO TABS: 100.0000 mg | ORAL_TABLET | Freq: Two times a day (BID) | ORAL | 0 refills | Status: DC

## 2016-04-26 NOTE — Progress Notes (Signed)
Trevor Brewer 25 y.o.   Chief Complaint  Patient presents with  . Recurrent Skin Infections    on right elbow and pt ran a test on puss and was positive for mrsa     HISTORY OF PRESENT ILLNESS: This is a 26 y.o. male complaining of right elbow infection; developed infected boil that ruptured and drained pus; works at a lab; cultured pus and Gram stained it; came back as MRSA; better but still active.  HPI   Prior to Admission medications   Medication Sig Start Date End Date Taking? Authorizing Provider  cetirizine (ZYRTEC) 10 MG tablet Take 10 mg by mouth daily as needed for allergies.    Historical Provider, MD  famotidine (PEPCID) 10 MG tablet Take 10 mg by mouth 2 (two) times daily as needed for heartburn or indigestion.    Historical Provider, MD  hydrocortisone (ANUSOL-HC) 2.5 % rectal cream Place 1 application rectally 2 (two) times daily. Patient not taking: Reported on 04/26/2016 05/05/15   Shade FloodJeffrey R Greene, MD  hydrocortisone cream 1 % Apply 1 application topically 2 (two) times a week. Reported on 05/05/2015    Historical Provider, MD    No Known Allergies  Patient Active Problem List   Diagnosis Date Noted  . No-show for appointment 02/08/2016  . Chest pain 06/19/2012  . Tachycardia 06/19/2012  . Other abnormal glucose 02/22/2012  . Routine general medical examination at a health care facility 02/22/2012  . GERD (gastroesophageal reflux disease) 02/22/2012    Past Medical History:  Diagnosis Date  . GERD (gastroesophageal reflux disease)     Past Surgical History:  Procedure Laterality Date  . arm surgery     fracture    Social History   Social History  . Marital status: Single    Spouse name: N/A  . Number of children: N/A  . Years of education: N/A   Occupational History  .  [employer]    Works at [employer]   Social History Main Topics  . Smoking status: Never Smoker  . Smokeless tobacco: Never Used  . Alcohol use 1.8 oz/week    3 Shots of liquor  per week  . Drug use: No  . Sexual activity: No   Other Topics Concern  . Not on file   Social History Narrative   ArchivistCollege Student: Senior at [employer]   Regular exercise: 3 times weekly   Caffeine use: daily, energy drinks    Family History  Problem Relation Age of Onset  . Diabetes Paternal Uncle   . Hypertension Paternal Uncle   . Alcohol abuse Neg Hx   . Cancer Neg Hx   . Early death Neg Hx   . Heart disease Neg Hx   . Hyperlipidemia Neg Hx   . Kidney disease Neg Hx   . Stroke Neg Hx      Review of Systems  Constitutional: Negative for chills and fever.  HENT: Negative for sore throat.   Eyes: Negative for discharge and redness.  Respiratory: Negative for cough and shortness of breath.   Cardiovascular: Negative for chest pain and palpitations.  Gastrointestinal: Negative for abdominal pain, diarrhea, nausea and vomiting.  Genitourinary: Negative for dysuria and hematuria.  Musculoskeletal: Negative for joint pain.  Skin: Positive for rash.  Neurological: Negative for dizziness and headaches.  Endo/Heme/Allergies: Negative.   All other systems reviewed and are negative.  Vitals:   04/26/16 1111  BP: 126/78  Pulse: 91  Resp: 18  Temp: 98.4 F (36.9 C)  Physical Exam  Constitutional: He is oriented to person, place, and time. He appears well-developed and well-nourished.  HENT:  Head: Normocephalic and atraumatic.  Eyes: EOM are normal. Pupils are equal, round, and reactive to light.  Neck: Normal range of motion. Neck supple.  Cardiovascular: Normal rate and regular rhythm.   Pulmonary/Chest: Effort normal and breath sounds normal.  Musculoskeletal: Normal range of motion.  Neurological: He is alert and oriented to person, place, and time.  Skin: Skin is warm and dry. Capillary refill takes less than 2 seconds.  Small erythematous dry lesion to right elbow; opened pustular lesion erythematous but dry.  Psychiatric: He has a normal mood and affect. His  behavior is normal.  Vitals reviewed.    ASSESSMENT & PLAN: Trevor Brewer was seen today for recurrent skin infections.  Diagnoses and all orders for this visit:  MRSA infection  Infection of elbow  Other orders -     doxycycline (VIBRA-TABS) 100 MG tablet; Take 1 tablet (100 mg total) by mouth 2 (two) times daily.    Patient Instructions       IF you received an x-ray today, you will receive an invoice from [facility]. Please contact [facility] at [phone] with questions or concerns regarding your invoice.   IF you received labwork today, you will receive an invoice from [facility]. Please contact [facility] at [phone] with questions or concerns regarding your invoice.   Our billing staff will not be able to assist you with questions regarding bills from these companies.  You will be contacted with the lab results as soon as they are available. The fastest way to get your results is to activate your My Chart account. Instructions are located on the last page of this paperwork. If you have not heard from [place] regarding the results in 2 weeks, please contact this office.      MRSA Infection, Adult MRSA stands for methicillin-resistant Staphylococcus aureus. This type of infection is caused by Staphylococcus aureus bacteria that are no longer affected by the medicines used to kill them (drug resistant). Staphylococcus (staph) bacteria are normally found on the skin or in the nose of healthy people. In most cases, these bacteria do not cause infection. But if these resistant bacteria enter your body through a cut or sore, they can cause a serious infection on your skin or in other parts of your body. There is a slight chance that the staph on your skin or in your nose is MRSA. There are two types of MRSA infections:  Hospital-acquired MRSA is bacteria that you get in the hospital.  Community-acquired MRSA is bacteria that you get somewhere other than in a  hospital. What increases the risk? Hospital-acquired MRSA is more common. You could be at risk for this infection if you are in the hospital and you:  Have surgery or a procedure.  Have an IV access or a catheter tube placed in your body.  Have weak resistance to germs (weakened immune system).  Are elderly.  Are on kidney dialysis. You could be at risk for community-acquired MRSA if you have a break in your skin and come into contact with MRSA. This may happen if you:  Play sports where there is skin-to-skin contact.  Live in a crowded setting, like a dormitory or a [employer].  Share towels, razors, or sports equipment with other people. What are the signs or symptoms? Symptoms of hospital-acquired MRSA depend on where MRSA has spread. Symptoms may include:  Wound infection.  Skin infection.  Rash.  Pneumonia.  Fever and chills.  Difficulty breathing.  Chest pain. Community-acquired MRSA is most likely to start as a scratch or cut that becomes infected. Symptoms may include:  A pus-filled pimple.  A boil on your skin.  Pus draining from your skin.  A sore (abscess) under your skin or somewhere in your body.  Fever with or without chills. How is this diagnosed? The diagnosis of MRSA is made by taking a sample from an infected area and sending it to a lab for testing. A lab technician can grow (culture) MRSA and check it under a microscope. The cultured MRSA can be tested to see which type of antibiotic medicine will work to treat it. Newer tests can identify MRSA more quickly by testing bacteria samples for MRSA genes. Your health care provider can diagnose MRSA using samples from:  Cuts or wounds in infected areas.  Nasal swabs.  Saliva or cough specimens from deep in the lungs (sputum).  Urine.  Blood. You may also have:  Imaging studies (such as X-ray or MRI) to check if the infection has spread to the lungs, bones, or joints.  A culture and  sensitivity test of blood or fluids from inside the joints. How is this treated? Treatment depends on how severe, deep, or extensive the infection is. Very bad infections may require a hospital stay.  Some skin infections, such as a small boil or sore (abscess), may be treated by draining pus from the site of the infection.  More extensive surgery to drain pus may be necessary for deeper or more widespread soft tissue infections.  You may then have to take antibiotic medicine given by mouth or through a vein. You may start antibiotic treatment right away or after testing can be done to see what antibiotic medicine should be used. Follow these instructions at home:  Take your antibiotics as directed by your health care provider. Take the medicine as prescribed until it is finished.  Avoid close contact with those around you as much as possible. Do not use towels, razors, toothbrushes, bedding, or other items that will be used by others.  Wash your hands frequently for 15 seconds with soap and water. Dry your hands with a clean or disposable towel.  When you are not able to wash your hands, use hand sanitizer that is more than 60 percent alcohol.  Wash towels, sheets, or clothes in the washing machine with detergent and hot water. Dry them in a hot dryer.  Follow your health care provider's instructions for wound care. Wash your hands before and after changing your bandages.  Always shower after exercising.  Keep all cuts and scrapes clean and covered with a bandage.  Be sure to tell all your health care providers that you have MRSA so they are aware of your infection. Contact a health care provider if:  You have a cut, scrape, pimple, or boil that becomes red, swollen, or painful or has pus in it.  You have pus draining from your skin.  You have an abscess under your skin or somewhere in your body. Get help right away if:  You have symptoms of a skin infection with a fever or  chills.  You have trouble breathing.  You have chest pain.  You have a skin wound and you become nauseous or start vomiting. This information is not intended to replace advice given to you by your health care provider. Make sure you discuss any questions you  have with your health care provider. Document Released: 01/15/2005 Document Revised: 06/23/2015 Document Reviewed: 11/07/2012 Elsevier Interactive Patient Education  2017 Elsevier Inc.      Edwina Barth, MD [facility]

## 2016-04-26 NOTE — Patient Instructions (Addendum)
   IF you received an x-ray today, you will receive an invoice from [facility]. Please contact [facility] at [phone] with questions or concerns regarding your invoice.   IF you received labwork today, you will receive an invoice from [facility]. Please contact [facility] at [phone] with questions or concerns regarding your invoice.   Our billing staff will not be able to assist you with questions regarding bills from these companies.  You will be contacted with the lab results as soon as they are available. The fastest way to get your results is to activate your My Chart account. Instructions are located on the last page of this paperwork. If you have not heard from us regarding the results in 2 weeks, please contact this office.     MRSA Infection, Adult MRSA stands for methicillin-resistant Staphylococcus aureus. This type of infection is caused by Staphylococcus aureus bacteria that are no longer affected by the medicines used to kill them (drug resistant). Staphylococcus (staph) bacteria are normally found on the skin or in the nose of healthy people. In most cases, these bacteria do not cause infection. But if these resistant bacteria enter your body through a cut or sore, they can cause a serious infection on your skin or in other parts of your body. There is a slight chance that the staph on your skin or in your nose is MRSA. There are two types of MRSA infections:  Hospital-acquired MRSA is bacteria that you get in the hospital.  Community-acquired MRSA is bacteria that you get somewhere other than in a hospital.  What increases the risk? Hospital-acquired MRSA is more common. You could be at risk for this infection if you are in the hospital and you:  Have surgery or a procedure.  Have an IV access or a catheter tube placed in your body.  Have weak resistance to germs (weakened immune system).  Are elderly.  Are on kidney dialysis.  You could be  at risk for community-acquired MRSA if you have a break in your skin and come into contact with MRSA. This may happen if you:  Play sports where there is skin-to-skin contact.  Live in a crowded setting, like a dormitory or a military barracks.  Share towels, razors, or sports equipment with other people.  What are the signs or symptoms? Symptoms of hospital-acquired MRSA depend on where MRSA has spread. Symptoms may include:  Wound infection.  Skin infection.  Rash.  Pneumonia.  Fever and chills.  Difficulty breathing.  Chest pain.  Community-acquired MRSA is most likely to start as a scratch or cut that becomes infected. Symptoms may include:  A pus-filled pimple.  A boil on your skin.  Pus draining from your skin.  A sore (abscess) under your skin or somewhere in your body.  Fever with or without chills.  How is this diagnosed? The diagnosis of MRSA is made by taking a sample from an infected area and sending it to a lab for testing. A lab technician can grow (culture) MRSA and check it under a microscope. The cultured MRSA can be tested to see which type of antibiotic medicine will work to treat it. Newer tests can identify MRSA more quickly by testing bacteria samples for MRSA genes. Your health care provider can diagnose MRSA using samples from:  Cuts or wounds in infected areas.  Nasal swabs.  Saliva or cough specimens from deep in the lungs (sputum).  Urine.  Blood.  You may also have:  Imaging   studies (such as X-ray or MRI) to check if the infection has spread to the lungs, bones, or joints.  A culture and sensitivity test of blood or fluids from inside the joints.  How is this treated? Treatment depends on how severe, deep, or extensive the infection is. Very bad infections may require a hospital stay.  Some skin infections, such as a small boil or sore (abscess), may be treated by draining pus from the site of the infection.  More extensive  surgery to drain pus may be necessary for deeper or more widespread soft tissue infections.  You may then have to take antibiotic medicine given by mouth or through a vein. You may start antibiotic treatment right away or after testing can be done to see what antibiotic medicine should be used.  Follow these instructions at home:  Take your antibiotics as directed by your health care provider. Take the medicine as prescribed until it is finished.  Avoid close contact with those around you as much as possible. Do not use towels, razors, toothbrushes, bedding, or other items that will be used by others.  Wash your hands frequently for 15 seconds with soap and water. Dry your hands with a clean or disposable towel.  When you are not able to wash your hands, use hand sanitizer that is more than 60 percent alcohol.  Wash towels, sheets, or clothes in the washing machine with detergent and hot water. Dry them in a hot dryer.  Follow your health care provider's instructions for wound care. Wash your hands before and after changing your bandages.  Always shower after exercising.  Keep all cuts and scrapes clean and covered with a bandage.  Be sure to tell all your health care providers that you have MRSA so they are aware of your infection. Contact a health care provider if:  You have a cut, scrape, pimple, or boil that becomes red, swollen, or painful or has pus in it.  You have pus draining from your skin.  You have an abscess under your skin or somewhere in your body. Get help right away if:  You have symptoms of a skin infection with a fever or chills.  You have trouble breathing.  You have chest pain.  You have a skin wound and you become nauseous or start vomiting. This information is not intended to replace advice given to you by your health care provider. Make sure you discuss any questions you have with your health care provider. Document Released: 01/15/2005 Document  Revised: 06/23/2015 Document Reviewed: 11/07/2012 Elsevier Interactive Patient Education  2017 Elsevier Inc.  

## 2020-12-27 DIAGNOSIS — E88819 Insulin resistance, unspecified: Secondary | ICD-10-CM | POA: Insufficient documentation

## 2020-12-27 DIAGNOSIS — E559 Vitamin D deficiency, unspecified: Secondary | ICD-10-CM | POA: Insufficient documentation

## 2020-12-27 DIAGNOSIS — E538 Deficiency of other specified B group vitamins: Secondary | ICD-10-CM | POA: Insufficient documentation

## 2022-04-20 DIAGNOSIS — F3181 Bipolar II disorder: Principal | ICD-10-CM | POA: Diagnosis present

## 2022-12-02 ENCOUNTER — Emergency Department (HOSPITAL_COMMUNITY)
Admission: EM | Admit: 2022-12-02 | Discharge: 2022-12-02 | Disposition: A | Discharge status: Other Acute Inpatient Hospital | Payer: PRIVATE HEALTH INSURANCE | Attending: Emergency Medicine | Admitting: Emergency Medicine

## 2022-12-02 ENCOUNTER — Encounter (HOSPITAL_COMMUNITY): Payer: Self-pay

## 2022-12-02 ENCOUNTER — Other Ambulatory Visit: Payer: Self-pay

## 2022-12-02 ENCOUNTER — Inpatient Hospital Stay (HOSPITAL_COMMUNITY)
Admission: AD | Admit: 2022-12-02 | Discharge: 2022-12-05 | DRG: 885 | Disposition: A | Discharge status: Home / Self Care | Payer: PRIVATE HEALTH INSURANCE | Source: Intra-hospital | Attending: Psychiatry | Admitting: Psychiatry

## 2022-12-02 ENCOUNTER — Encounter (HOSPITAL_COMMUNITY): Payer: Self-pay | Admitting: Psychiatry

## 2022-12-02 DIAGNOSIS — X58XXXA Exposure to other specified factors, initial encounter: Secondary | ICD-10-CM | POA: Diagnosis not present

## 2022-12-02 DIAGNOSIS — T43292A Poisoning by other antidepressants, intentional self-harm, initial encounter: Secondary | ICD-10-CM | POA: Diagnosis present

## 2022-12-02 DIAGNOSIS — Z8249 Family history of ischemic heart disease and other diseases of the circulatory system: Secondary | ICD-10-CM | POA: Diagnosis not present

## 2022-12-02 DIAGNOSIS — Z818 Family history of other mental and behavioral disorders: Secondary | ICD-10-CM | POA: Diagnosis not present

## 2022-12-02 DIAGNOSIS — Z79899 Other long term (current) drug therapy: Secondary | ICD-10-CM

## 2022-12-02 DIAGNOSIS — T1491XA Suicide attempt, initial encounter: Secondary | ICD-10-CM | POA: Insufficient documentation

## 2022-12-02 DIAGNOSIS — Z7985 Long-term (current) use of injectable non-insulin antidiabetic drugs: Secondary | ICD-10-CM | POA: Diagnosis not present

## 2022-12-02 DIAGNOSIS — T43212A Poisoning by selective serotonin and norepinephrine reuptake inhibitors, intentional self-harm, initial encounter: Secondary | ICD-10-CM | POA: Diagnosis present

## 2022-12-02 DIAGNOSIS — F10920 Alcohol use, unspecified with intoxication, uncomplicated: Secondary | ICD-10-CM

## 2022-12-02 DIAGNOSIS — Y908 Blood alcohol level of 240 mg/100 ml or more: Secondary | ICD-10-CM | POA: Insufficient documentation

## 2022-12-02 DIAGNOSIS — Z833 Family history of diabetes mellitus: Secondary | ICD-10-CM

## 2022-12-02 DIAGNOSIS — K219 Gastro-esophageal reflux disease without esophagitis: Secondary | ICD-10-CM | POA: Diagnosis present

## 2022-12-02 DIAGNOSIS — F10120 Alcohol abuse with intoxication, uncomplicated: Secondary | ICD-10-CM | POA: Insufficient documentation

## 2022-12-02 DIAGNOSIS — F332 Major depressive disorder, recurrent severe without psychotic features: Secondary | ICD-10-CM | POA: Diagnosis present

## 2022-12-02 DIAGNOSIS — R000 Tachycardia, unspecified: Secondary | ICD-10-CM | POA: Diagnosis not present

## 2022-12-02 DIAGNOSIS — F314 Bipolar disorder, current episode depressed, severe, without psychotic features: Principal | ICD-10-CM | POA: Diagnosis present

## 2022-12-02 DIAGNOSIS — R7303 Prediabetes: Secondary | ICD-10-CM | POA: Diagnosis present

## 2022-12-02 DIAGNOSIS — F411 Generalized anxiety disorder: Secondary | ICD-10-CM | POA: Diagnosis present

## 2022-12-02 DIAGNOSIS — Z811 Family history of alcohol abuse and dependence: Secondary | ICD-10-CM

## 2022-12-02 DIAGNOSIS — F10229 Alcohol dependence with intoxication, unspecified: Secondary | ICD-10-CM | POA: Diagnosis present

## 2022-12-02 DIAGNOSIS — F1020 Alcohol dependence, uncomplicated: Secondary | ICD-10-CM | POA: Diagnosis present

## 2022-12-02 DIAGNOSIS — F3181 Bipolar II disorder: Principal | ICD-10-CM | POA: Diagnosis present

## 2022-12-02 DIAGNOSIS — F319 Bipolar disorder, unspecified: Principal | ICD-10-CM | POA: Diagnosis present

## 2022-12-02 DIAGNOSIS — F1010 Alcohol abuse, uncomplicated: Secondary | ICD-10-CM | POA: Insufficient documentation

## 2022-12-02 DIAGNOSIS — F3162 Bipolar disorder, current episode mixed, moderate: Secondary | ICD-10-CM | POA: Insufficient documentation

## 2022-12-02 DIAGNOSIS — T5192XA Toxic effect of unspecified alcohol, intentional self-harm, initial encounter: Secondary | ICD-10-CM | POA: Diagnosis present

## 2022-12-02 LAB — COMPREHENSIVE METABOLIC PANEL
ALT: 21 U/L (ref 0–44)
AST: 19 U/L (ref 15–41)
Albumin: 4.2 g/dL (ref 3.5–5.0)
Alkaline Phosphatase: 55 U/L (ref 38–126)
Anion gap: 13 (ref 5–15)
BUN: 9 mg/dL (ref 6–20)
CO2: 21 mmol/L — ABNORMAL LOW (ref 22–32)
Calcium: 8.4 mg/dL — ABNORMAL LOW (ref 8.9–10.3)
Chloride: 106 mmol/L (ref 98–111)
Creatinine, Ser: 0.76 mg/dL (ref 0.61–1.24)
GFR, Estimated: >60 mL/min (ref >60)
Glucose, Bld: 103 mg/dL — ABNORMAL HIGH (ref 70–99)
Potassium: 3.4 mmol/L — ABNORMAL LOW (ref 3.5–5.1)
Sodium: 140 mmol/L (ref 135–145)
Total Bilirubin: 0.3 mg/dL (ref 0.3–1.2)
Total Protein: 8 g/dL (ref 6.5–8.1)

## 2022-12-02 LAB — CBC
HCT: 48.6 % (ref 39.0–52.0)
Hemoglobin: 15.9 g/dL (ref 13.0–17.0)
MCH: 28.6 pg (ref 26.0–34.0)
MCHC: 32.7 g/dL (ref 30.0–36.0)
MCV: 87.6 fL (ref 80.0–100.0)
Platelets: 354 10*3/uL (ref 150–400)
RBC: 5.55 MIL/uL (ref 4.22–5.81)
RDW: 14.8 % (ref 11.5–15.5)
WBC: 8.7 10*3/uL (ref 4.0–10.5)
nRBC: 0 % (ref 0.0–0.2)

## 2022-12-02 LAB — RAPID URINE DRUG SCREEN, HOSP PERFORMED
Amphetamines: NOT DETECTED
Barbiturates: NOT DETECTED
Benzodiazepines: NOT DETECTED
Cocaine: NOT DETECTED
Opiates: NOT DETECTED
Tetrahydrocannabinol: NOT DETECTED

## 2022-12-02 LAB — ETHANOL: Alcohol, Ethyl (B): 289 mg/dL — ABNORMAL HIGH (ref <10)

## 2022-12-02 LAB — ACETAMINOPHEN LEVEL
Acetaminophen (Tylenol), Serum: <10 ug/mL — ABNORMAL LOW (ref 10–30)
Acetaminophen (Tylenol), Serum: <10 ug/mL — ABNORMAL LOW (ref 10–30)

## 2022-12-02 LAB — SALICYLATE LEVEL: Salicylate Lvl: <7 mg/dL — ABNORMAL LOW (ref 7.0–30.0)

## 2022-12-02 MED ORDER — LORAZEPAM 1 MG PO TABS
1.0000 mg | ORAL_TABLET | Freq: Four times a day (QID) | ORAL | Status: AC | PRN
  Administered 2022-12-02: 1 mg via ORAL
  Filled 2022-12-02 (×2): qty 1

## 2022-12-02 MED ORDER — LORAZEPAM 1 MG PO TABS: 2.0000 mg | ORAL_TABLET | Freq: Three times a day (TID) | ORAL | Status: DC | PRN

## 2022-12-02 MED ORDER — THIAMINE HCL 100 MG/ML IJ SOLN
100.0000 mg | Freq: Once | INTRAMUSCULAR | Status: AC
  Administered 2022-12-02: 100 mg via INTRAMUSCULAR
  Filled 2022-12-02: qty 2

## 2022-12-02 MED ORDER — ONDANSETRON 4 MG PO TBDP: 4.0000 mg | ORAL_TABLET | Freq: Four times a day (QID) | ORAL | Status: AC | PRN

## 2022-12-02 MED ORDER — ADULT MULTIVITAMIN W/MINERALS CH
1.0000 | ORAL_TABLET | Freq: Every day | ORAL | Status: DC
  Administered 2022-12-02: 1 via ORAL
  Filled 2022-12-02: qty 1

## 2022-12-02 MED ORDER — STERILE WATER FOR INJECTION IJ SOLN
INTRAMUSCULAR | Status: AC
  Filled 2022-12-02: qty 10

## 2022-12-02 MED ORDER — ALUM & MAG HYDROXIDE-SIMETH 200-200-20 MG/5ML PO SUSP: 30.0000 mL | ORAL | Status: DC | PRN

## 2022-12-02 MED ORDER — LORAZEPAM 2 MG/ML IJ SOLN: 2.0000 mg | Freq: Three times a day (TID) | INTRAMUSCULAR | Status: DC | PRN

## 2022-12-02 MED ORDER — POTASSIUM CHLORIDE CRYS ER 20 MEQ PO TBCR
20.0000 meq | EXTENDED_RELEASE_TABLET | Freq: Once | ORAL | Status: AC
  Administered 2022-12-02: 20 meq via ORAL
  Filled 2022-12-02: qty 1

## 2022-12-02 MED ORDER — HYDROXYZINE HCL 25 MG PO TABS: 25.0000 mg | ORAL_TABLET | Freq: Three times a day (TID) | ORAL | Status: DC | PRN

## 2022-12-02 MED ORDER — LORAZEPAM 1 MG PO TABS: 1.0000 mg | ORAL_TABLET | ORAL | Status: DC | PRN

## 2022-12-02 MED ORDER — ADULT MULTIVITAMIN W/MINERALS CH
1.0000 | ORAL_TABLET | Freq: Every day | ORAL | Status: DC
  Administered 2022-12-03 – 2022-12-05 (×3): 1 via ORAL
  Filled 2022-12-02 (×5): qty 1

## 2022-12-02 MED ORDER — HYDROXYZINE HCL 25 MG PO TABS
25.0000 mg | ORAL_TABLET | Freq: Four times a day (QID) | ORAL | Status: DC | PRN
Start: 2022-12-02 — End: 2022-12-02

## 2022-12-02 MED ORDER — DIPHENHYDRAMINE HCL 25 MG PO CAPS: 50.0000 mg | ORAL_CAPSULE | Freq: Three times a day (TID) | ORAL | Status: DC | PRN

## 2022-12-02 MED ORDER — LORAZEPAM 1 MG PO TABS
1.0000 mg | ORAL_TABLET | Freq: Four times a day (QID) | ORAL | Status: DC | PRN
Start: 2022-12-02 — End: 2022-12-02

## 2022-12-02 MED ORDER — THIAMINE MONONITRATE 100 MG PO TABS: 100.0000 mg | ORAL_TABLET | Freq: Every day | ORAL | Status: DC

## 2022-12-02 MED ORDER — ACETAMINOPHEN 325 MG PO TABS: 650.0000 mg | ORAL_TABLET | Freq: Four times a day (QID) | ORAL | Status: DC | PRN

## 2022-12-02 MED ORDER — HALOPERIDOL LACTATE 5 MG/ML IJ SOLN: 5.0000 mg | Freq: Three times a day (TID) | INTRAMUSCULAR | Status: DC | PRN

## 2022-12-02 MED ORDER — TRAZODONE HCL 50 MG PO TABS
50.0000 mg | ORAL_TABLET | Freq: Every evening | ORAL | Status: DC | PRN
  Administered 2022-12-02 – 2022-12-04 (×3): 50 mg via ORAL
  Filled 2022-12-02 (×3): qty 1

## 2022-12-02 MED ORDER — HALOPERIDOL 5 MG PO TABS: 5.0000 mg | ORAL_TABLET | Freq: Three times a day (TID) | ORAL | Status: DC | PRN

## 2022-12-02 MED ORDER — LOPERAMIDE HCL 2 MG PO CAPS: 2.0000 mg | ORAL_CAPSULE | ORAL | Status: DC | PRN

## 2022-12-02 MED ORDER — DIPHENHYDRAMINE HCL 50 MG/ML IJ SOLN: 50.0000 mg | Freq: Three times a day (TID) | INTRAMUSCULAR | Status: DC | PRN

## 2022-12-02 MED ORDER — MAGNESIUM HYDROXIDE 400 MG/5ML PO SUSP: 30.0000 mL | Freq: Every day | ORAL | Status: DC | PRN

## 2022-12-02 MED ORDER — ZIPRASIDONE MESYLATE 20 MG IM SOLR
INTRAMUSCULAR | Status: AC
  Filled 2022-12-02: qty 20

## 2022-12-02 MED ORDER — ONDANSETRON 4 MG PO TBDP: 4.0000 mg | ORAL_TABLET | Freq: Four times a day (QID) | ORAL | Status: DC | PRN

## 2022-12-02 MED ORDER — NICOTINE 14 MG/24HR TD PT24
14.0000 mg | MEDICATED_PATCH | Freq: Every day | TRANSDERMAL | Status: DC
  Administered 2022-12-03 – 2022-12-05 (×3): 14 mg via TRANSDERMAL
  Filled 2022-12-02 (×5): qty 1

## 2022-12-02 MED ORDER — VITAMIN B-1 100 MG PO TABS
100.0000 mg | ORAL_TABLET | Freq: Every day | ORAL | Status: DC
  Administered 2022-12-03 – 2022-12-05 (×3): 100 mg via ORAL
  Filled 2022-12-02 (×5): qty 1

## 2022-12-02 MED ORDER — LOPERAMIDE HCL 2 MG PO CAPS: 2.0000 mg | ORAL_CAPSULE | ORAL | Status: AC | PRN

## 2022-12-02 NOTE — ED Notes (Signed)
Restraints discontinued at 0100 est

## 2022-12-02 NOTE — ED Notes (Signed)
Pt changed into purple scrubs by Truddie Hidden, RN and Doris Cheadle, RN. Pt belongings placed in cabinet behind nurses station. Meds secured at nurses station as well. Pt is resting comfortably in bed with call bell in reach, monitors attached, and bed in lowest position.

## 2022-12-02 NOTE — Progress Notes (Incomplete)
  ADMISSION DAR NOTE:   Pt presented from [facility] under Involuntary status. Alert and oriented by 3. Pt observed with sad and anxious affect, logical speech and fair eye contact.  Reports worsening depression  and anxiety due to relationship issues. 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   Patients report being sad, poor sleep. Patient is Tacky and endorse anxiety he is shaking very tremulous. Patient stated he has been taking Vrayla, Cymbalta and Trazodone for about 4 months and Lamictal for 2 months is seen at [facility] by Dr Osie Cheeks. Patient would like to get out Patient therapist. Trevor Brewer has family history of Mental health anxiety, depression and Bipolar. Denies any abuse.   Emotional support and availability offered to Patient as needed. Skin assessment done and belongings searched per protocol. Items deemed contraband secured in locker. Unit orientation and routine discussed, Care Plan reviewed as well and Patient verbalized understanding. Fluids and Food offered, tolerated well. Q15 minutes safety checks initiated without self harm gestures.

## 2022-12-02 NOTE — ED Notes (Signed)
Called Poison Control and spoke to [place], [place] regarding side effects, tx, and monitoring for possible ingestion of vraylar, lamotrigine, or duloxetine. Dennie Bible, RN recommended to monitor for 8 hrs and look out for sedation, seizures, hypotension, QRS deviation and QT/QTC prolongation. Repeat EKG will be needed in 6 hrs. Tx consists of fluids and benzos. Labs include CMP, salicylate and tylenol level. Peterson Lombard, MD is aware.

## 2022-12-02 NOTE — Tx Team (Signed)
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"   "I am having relationship issues"  "I am Gay and my Family does not know"  "I have been drinking a lot lately"               DISCHARGE CRITERIA:  Ability to meet basic life and health needs Improved stabilization in mood, thinking, and/or behavior Motivation to continue treatment in a less acute level of care Need for constant or close observation no longer present Reduction of life-threatening or endangering symptoms to within safe limits Verbal commitment to aftercare and medication compliance Withdrawal symptoms are absent or subacute and managed without 24-hour nursing intervention  PRELIMINARY DISCHARGE PLAN: Outpatient therapy Return to previous living arrangement Return to previous work or school arrangements  PATIENT/FAMILY INVOLVEMENT: This treatment plan has been presented to and reviewed with the patient, [place] Barman.  The patient and family have been given the opportunity to ask questions and make suggestions.  Margarita Rana, RN 12/02/2022, 11:24 PM

## 2022-12-02 NOTE — ED Triage Notes (Signed)
Pt to ED by EMS from home with c/o intentional overdose. Pt is intoxicated and took an unknown amount of  lamictal, vraylar, and cymbalta. Pt has flight of ideas in triage. Pt has unknown amount of liquor to drink. VSS, NADN.

## 2022-12-02 NOTE — ED Notes (Signed)
Pt reminded of need for urine sample, pt states he does not need to urinate at this time

## 2022-12-02 NOTE — Consult Note (Signed)
[facility] ED ASSESSMENT    Reason for Consult: SI Referring Physician: Dr. Madilyn Hook  Patient Identification: Trevor Brewer MRN:  [id number] ED Chief Complaint: MDD (major depressive disorder), recurrent severe, without psychosis (HCC)  Diagnosis:  Principal Problem:   MDD (major depressive disorder), recurrent severe, without psychosis (HCC) Active Problems:   Alcohol abuse   GAD (generalized anxiety disorder)   ED Assessment Time Calculation: Start Time: 1000 Stop Time: 1040 Total Time in Minutes (Assessment Completion): 40   Subjective: Trevor Brewer is a 32 y.o. male patient with history of bipolar disorder, anxiety here for evaluation for concern for suicide attempt with alcohol intoxication.   HPI:  Trevor Brewer, 32 y.o., male patient seen face to face by this provider, consulted with Dr. Jannifer Franklin; and chart reviewed on 12/02/22.  On evaluation Trevor Brewer reports that he was having a bad night, states that he thinks he got into an argument with his potential significant other, said his emotions were high and he felt like "I was in my own insecurities ".  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  Patient states he lives alone, denies using any illicit drugs, UDS is negative for drugs, BAL is 289.  Patient states that he only drinks alcohol occasionally, says because he was arguing with his potential significant other that he had more to drink than usual, states that he knows he should not have taken the pills and feels remorseful.  Patient currently denies SI/HI/AVH, paranoia, states he has never been admitted to a psychiatric inpatient facility.  States he does  have a diagnosis mixed bipolar which he was diagnosed this year in 2024.  He states he is compliant with his medications which consist of Cymbalta 120 mg, Vraylar 3 mg, and Lamictal 50 mg.  He also stated that he  [facility], states his last appointment with her was on November 23, 2022.  He denies having a therapist, but would like to start seeing a therapist.  She states that he does have a good support circle of friends and that his mother, father, and brother living [facility], and says that he is close with his family.  During evaluation Trevor Orndoff is laying on stretcher and appears to be in no acute distress.  He is alert, oriented x 4, calm, cooperative and attentive. His mood is depressed with congruent affect.  He has normal speech, and behavior.  Objectively there is no evidence of psychosis/mania or delusional thinking.  Patient is able to converse coherently, goal directed thoughts, no distractibility, or pre-occupation.  He is currently denies suicidal/self-harm/homicidal ideation, psychosis, and paranoia.  Patient is a 32 year old male with a history of bipolar and alcohol abuse.  Patient has minimized how bad his alcohol abuse is, also states that he is a homosexual, and his parents are not aware, only his brother.  Patient also states that his  family does not know he has a mental health diagnosis of bipolar and that he is on medications.  With the patient's permission spoke with 2 of his male friends, and work Acupuncturist, [place] F. [phone], and Glean Salvo. [phone].  They both feel that when patient is sober, he is appropriate.  States when patient drinks alcohol he spirals down and has mood swings and depression.  Each of these ladies  have known patient about 4 years.  They state that he has currently been drinking more each week, stating he has been drinking about 2 to 3 days a week, liquor, and they have not seen him like this, drinking as much.  They are in agreement that patient is a Programmer, multimedia as a Designer, jewellery and that he is doing well in school.   Past Psychiatric History: Mixed bipolar, generalized anxiety  disorder  Risk to Self or Others: Risk to Self:  Yes Risk to Others:  No  Prior Inpatient Therapy: No  Prior Outpatient Therapy: Yes   Social Support: Parents and friends  Trauma hx (sexual, emotional, physical): Denies  Access to firearms: N/A  Substance Use History:  Alcohol: Occasionally Tobacco: Denies Other illicit drug use: Denies   [place] Scale:  Flowsheet Row ED from 12/02/2022 in [facility] Emergency Department at [facility]  C-SSRS RISK CATEGORY No Risk       AIMS:  , , ,  ,   ASAM:    Substance Abuse:     Past Medical History:  Past Medical History:  Diagnosis Date   GERD (gastroesophageal reflux disease)     Past Surgical History:  Procedure Laterality Date   arm surgery     fracture   Family History:  Family History  Problem Relation Age of Onset   Diabetes Paternal Uncle    Hypertension Paternal Uncle    Alcohol abuse Neg Hx    Cancer Neg Hx    Early death Neg Hx    Heart disease Neg Hx    Hyperlipidemia Neg Hx    Kidney disease Neg Hx    Stroke Neg Hx    Family Psychiatric  History: Father have history of alcohol, drug abuse, depression ans anxiety and mother also have depression and anxiey   Social History:  Social History   Substance and Sexual Activity  Alcohol Use Yes   Alcohol/week: 3.0 standard drinks of alcohol   Types: 3 Shots of liquor per week     Social History   Substance and Sexual Activity  Drug Use No    Social History   Socioeconomic History   Marital status: Single    Spouse name: Not on file   Number of children: Not on file   Years of education: Not on file   Highest education level: Not on file  Occupational History    Employer: [employer]    Comment: Works at [employer]  Tobacco Use   Smoking status: Never   Smokeless tobacco: Never  Substance and Sexual Activity   Alcohol use: Yes    Alcohol/week: 3.0 standard drinks of alcohol    Types: 3 Shots of liquor per week   Drug use: No    Sexual activity: Never  Other Topics Concern   Not on file  Social History Narrative   College Student: Senior at [employer]   Regular exercise: 3 times weekly   Caffeine use: daily, energy drinks   Social Determinants of Health   Financial Resource Strain: Low Risk  (03/08/2020)   Received from [facility] visits prior to 03/31/2022.   Overall Financial Resource Strain (CARDIA)    Difficulty of Paying Living Expenses: Not hard at all  Food Insecurity: Low Risk  (09/17/2022)   Received from [facility]   Hunger Vital Sign    Worried About Running Out of Food in the Last Year: Never true    Ran Out of Food  in the Last Year: Never true  Transportation Needs: Not on file (09/17/2022)  Physical Activity: Unknown (03/08/2020)   [facility] visits prior to 03/31/2022.   Exercise Vital Sign    Days of Exercise per Week: 2 days    Minutes of Exercise per Session: Not on file  Stress: Stress Concern Present (03/08/2020)   Received from [facility] visits prior to 03/31/2022.   [employer] of Occupational Health - Occupational Stress Questionnaire    Feeling of Stress : To some extent  Social Connections: Moderately Isolated (03/08/2020)   Received from [facility] visits prior to 03/31/2022.   Social Advertising account executive [NHANES]    Frequency of Communication with Friends and Family: More than three times a week    Frequency of Social Gatherings with Friends and Family: More than three times a week    Attends Religious Services: 1 to 4 times per year    Active Member of [employer] or Organizations: No    Attends Banker Meetings: Never    Marital Status: Never married      Allergies:  No Known Allergies  Labs:  Results for orders placed or performed during the hospital encounter of 12/02/22 (from the past 48 hour(s))  Comprehensive metabolic panel     Status: Abnormal    Collection Time: 12/02/22  1:55 AM  Result Value Ref Range   Sodium 140 135 - 145 mmol/L   Potassium 3.4 (L) 3.5 - 5.1 mmol/L   Chloride 106 98 - 111 mmol/L   CO2 21 (L) 22 - 32 mmol/L   Glucose, Bld 103 (H) 70 - 99 mg/dL    Comment: Glucose reference range applies only to samples taken after fasting for at least 8 hours.   BUN 9 6 - 20 mg/dL   Creatinine, Ser 6.28 0.61 - 1.24 mg/dL   Calcium 8.4 (L) 8.9 - 10.3 mg/dL   Total Protein 8.0 6.5 - 8.1 g/dL   Albumin 4.2 3.5 - 5.0 g/dL   AST 19 15 - 41 U/L   ALT 21 0 - 44 U/L   Alkaline Phosphatase 55 38 - 126 U/L   Total Bilirubin 0.3 0.3 - 1.2 mg/dL   GFR, Estimated >31 >51 mL/min    Comment: (NOTE) Calculated using the CKD-EPI Creatinine Equation (2021)    Anion gap 13 5 - 15    Comment: Performed at [facility], [address], [place], [place] [address]  Ethanol     Status: Abnormal   Collection Time: 12/02/22  1:55 AM  Result Value Ref Range   Alcohol, Ethyl (B) 289 (H) <10 mg/dL    Comment: (NOTE) Lowest detectable limit for serum alcohol is 10 mg/dL.  For medical purposes only. Performed at [facility], [address], [place], [place] [address]   Salicylate level     Status: Abnormal   Collection Time: 12/02/22  1:55 AM  Result Value Ref Range   Salicylate Lvl <7.0 (L) 7.0 - 30.0 mg/dL    Comment: Performed at [facility], [address], [place], [place] [address]  Acetaminophen level     Status: Abnormal   Collection Time: 12/02/22  1:55 AM  Result Value Ref Range   Acetaminophen (Tylenol), Serum <10 (L) 10 - 30 ug/mL    Comment: (NOTE) Therapeutic concentrations vary significantly. A range of 10-30 ug/mL  may be an effective concentration for  many patients. However, some  are best treated at concentrations outside of this range. Acetaminophen concentrations >150 ug/mL at 4 hours after ingestion  and >50 ug/mL at 12 hours after ingestion are often  associated with  toxic reactions.  Performed at [facility], [address], [place], [place] [address]   cbc     Status: None   Collection Time: 12/02/22  1:55 AM  Result Value Ref Range   WBC 8.7 4.0 - 10.5 K/uL   RBC 5.55 4.22 - 5.81 MIL/uL   Hemoglobin 15.9 13.0 - 17.0 g/dL   HCT 34.7 42.5 - 95.6 %   MCV 87.6 80.0 - 100.0 fL   MCH 28.6 26.0 - 34.0 pg   MCHC 32.7 30.0 - 36.0 g/dL   RDW 38.7 56.4 - 33.2 %   Platelets 354 150 - 400 K/uL   nRBC 0.0 0.0 - 0.2 %    Comment: Performed at [facility], [address] [place], [place] [address]  Acetaminophen level     Status: Abnormal   Collection Time: 12/02/22  4:24 AM  Result Value Ref Range   Acetaminophen (Tylenol), Serum <10 (L) 10 - 30 ug/mL    Comment: (NOTE) Therapeutic concentrations vary significantly. A range of 10-30 ug/mL  may be an effective concentration for many patients. However, some  are best treated at concentrations outside of this range. Acetaminophen concentrations >150 ug/mL at 4 hours after ingestion  and >50 ug/mL at 12 hours after ingestion are often associated with  toxic reactions.  Performed at [facility], [address] [place], [place] [address]   Rapid urine drug screen (hospital performed)     Status: None   Collection Time: 12/02/22  8:50 AM  Result Value Ref Range   Opiates NONE DETECTED NONE DETECTED   Cocaine NONE DETECTED NONE DETECTED   Benzodiazepines NONE DETECTED NONE DETECTED   Amphetamines NONE DETECTED NONE DETECTED   Tetrahydrocannabinol NONE DETECTED NONE DETECTED   Barbiturates NONE DETECTED NONE DETECTED    Comment: (NOTE) DRUG SCREEN FOR MEDICAL PURPOSES ONLY.  IF CONFIRMATION IS NEEDED FOR ANY PURPOSE, NOTIFY LAB WITHIN 5 DAYS.  LOWEST DETECTABLE LIMITS FOR URINE DRUG SCREEN Drug Class                     Cutoff (ng/mL) Amphetamine and metabolites    1000 Barbiturate and metabolites     200 Benzodiazepine                 200 Opiates and metabolites        300 Cocaine and metabolites        300 THC                            50 Performed at [facility], [address] [place], [place] [address]     Current Facility-Administered Medications  Medication Dose Route Frequency Provider Last Rate Last Admin   hydrOXYzine (ATARAX) tablet 25 mg  25 mg Oral Q6H PRN Motley-Mangrum, Graden Hoshino A, PMHNP       loperamide (IMODIUM) capsule 2-4 mg  2-4 mg Oral PRN Motley-Mangrum, Zena Vitelli A, PMHNP       LORazepam (ATIVAN) tablet 1 mg  1 mg Oral Q6H PRN Motley-Mangrum, Dreon Pineda A, PMHNP       multivitamin with minerals tablet 1 tablet  1 tablet Oral Daily Motley-Mangrum, Ezra Sites, PMHNP  ondansetron (ZOFRAN-ODT) disintegrating tablet 4 mg  4 mg Oral Q6H PRN Motley-Mangrum, Ayomikun Starling A, PMHNP       thiamine (VITAMIN B1) injection 100 mg  100 mg Intramuscular Once Motley-Mangrum, Geralynn Ochs A, PMHNP       [START ON 12/03/2022] thiamine (VITAMIN B1) tablet 100 mg  100 mg Oral Daily Motley-Mangrum, Gabryel Talamo A, PMHNP       Current Outpatient Medications  Medication Sig Dispense Refill   DULoxetine (CYMBALTA) 60 MG capsule Take 120 mg by mouth daily.     famotidine (PEPCID) 10 MG tablet Take 20-30 mg by mouth as needed for heartburn or indigestion.     lamoTRIgine (LAMICTAL) 25 MG tablet Take 50 mg by mouth daily.     traZODone (DESYREL) 50 MG tablet Take 50 mg by mouth at bedtime as needed for sleep.     VRAYLAR 3 MG capsule Take 3 mg by mouth daily.     WEGOVY 2.4 MG/0.75ML SOAJ Inject 2.4 mg into the skin once a week.     busPIRone (BUSPAR) 10 MG tablet Take 10 mg by mouth 2 (two) times daily. (Patient not taking: Reported on 12/02/2022)     ZEPBOUND 5 MG/0.5ML Pen Inject into the skin. (Patient not taking: Reported on 12/02/2022)      Musculoskeletal: Strength & Muscle Tone: within normal limits Gait & Station: normal Patient leans: N/A   Psychiatric Specialty  Exam: Presentation  General Appearance:  Appropriate for Environment  Eye Contact: Good  Speech: Clear and Coherent  Speech Volume: Normal  Handedness: Right   Mood and Affect  Mood: Anxious; Depressed  Affect: Appropriate   Thought Process  Thought Processes: Coherent  Descriptions of Associations:Intact  Orientation:Full (Time, Place and Person)  Thought Content:WDL  History of Schizophrenia/Schizoaffective disorder:No data recorded Duration of Psychotic Symptoms:No data recorded Hallucinations:Hallucinations: None  Ideas of Reference:None  Suicidal Thoughts:Suicidal Thoughts: Yes, Passive SI Passive Intent and/or Plan: Without Plan  Homicidal Thoughts:Homicidal Thoughts: No   Sensorium  Memory: Immediate Good; Recent Good  Judgment: Poor  Insight: Fair   Chartered certified accountant: Fair  Attention Span: Fair  Recall: Good  Fund of Knowledge: Good  Language: Good   Psychomotor Activity  Psychomotor Activity: Psychomotor Activity: Normal   Assets  Assets: Communication Skills; Desire for Improvement; Housing; Health and safety inspector; Social Support; Transportation    Sleep  Sleep: Sleep: Poor   Physical Exam: Physical Exam Vitals and nursing note reviewed. Exam conducted with a chaperone present.  Neurological:     Mental Status: He is alert.  Psychiatric:        Attention and Perception: Attention normal.        Mood and Affect: Mood is depressed. Affect is flat.        Speech: Speech normal.        Behavior: Behavior is cooperative.        Thought Content: Thought content includes suicidal ideation.        Cognition and Memory: Memory normal.        Judgment: Judgment is inappropriate.    Review of Systems  Constitutional: Negative.   Psychiatric/Behavioral:  Positive for depression and substance abuse.    Blood pressure 134/76, pulse (!) 105, temperature 97.6 F (36.4 C), temperature  source Axillary, resp. rate 13, height 5\' 10"  (1.778 m), weight 86.2 kg, SpO2 100%. Body mass index is 27.26 kg/m.   Medical Decision Making: Pt case reviewed and discussed with Dr. Lucianne Muss. Pt does meet criteria for IVC and inpatient  psychiatric treatment. Patient needs inpatient psychiatric admission for stabilization and treatment. [facility] and CSW notified of disposition. Will restart patient's home medications. EDP, RN, and LCSW notified of disposition.   Patient is currently being reviewed by behavioral health Hospital.   Disposition: Recommend psychiatric Inpatient admission.  Alona Bene, PMHNP 12/02/2022 1:04 PM

## 2022-12-02 NOTE — Progress Notes (Signed)
[facility]/BMU LCSW Progress Note   12/02/2022    4:06 PM  Trevor Brewer   [id number]   Type of Contact and Topic:  Psychiatric Bed Placement   Pt accepted to [facility]-1    Patient meets inpatient criteria per Alona Bene, NP   The attending provider will be Dr. Abbott Pao   Call report to [phone]    Mateo Flow, RN @ [facility] ED notified.     Pt scheduled  to arrive at [facility] TODAY at 2030.    Damita Dunnings, MSW, LCSW-A  4:07 PM 12/02/2022

## 2022-12-02 NOTE — ED Provider Notes (Signed)
[place] EMERGENCY DEPARTMENT AT [facility] Provider Note   CSN: [id number] Arrival date & time: 12/02/22  4098     History  Chief Complaint  Patient presents with   Suicidal   Alcohol Intoxication   Ingestion    [place] Dung is a 32 y.o. male.  The history is provided by the patient and medical records.  Alcohol Intoxication  Ingestion  [place] Grotz is a 32 y.o. male who presents to the Emergency Department complaining of suicidal.  He presents to the ED by EMS for suicide attempt.  History is limited secondary to agitation and intoxication.  Per report he took an intentional overdose on unknown medications.  He does have Lamictal, Vraylar and Cymbalta prescribed to him.  Unknown time when these were adjusted.  He also did drink an unknown amount of alcohol.  Per report he was agitated upon ED arrival and required sedation with Geodon.  On evaluation after Geodon patient states that he is unsure why he is in the emergency department but when asked if he attempted to harm himself he said maybe.     Home Medications Prior to Admission medications   Medication Sig Start Date End Date Taking? Authorizing Provider  cetirizine (ZYRTEC) 10 MG tablet Take 10 mg by mouth daily as needed for allergies.    [provider]  doxycycline (VIBRA-TABS) 100 MG tablet Take 1 tablet (100 mg total) by mouth 2 (two) times daily. 04/26/16   Georgina Quint, MD  famotidine (PEPCID) 10 MG tablet Take 10 mg by mouth 2 (two) times daily as needed for heartburn or indigestion.    [provider]  hydrocortisone (ANUSOL-HC) 2.5 % rectal cream Place 1 application rectally 2 (two) times daily. Patient not taking: Reported on 04/26/2016 05/05/15   Shade Flood, MD  hydrocortisone cream 1 % Apply 1 application topically 2 (two) times a week. Reported on 05/05/2015    [provider]      Allergies    Patient has no known allergies.    Review of Systems    Review of Systems  Unable to perform ROS: Psychiatric disorder    Physical Exam Updated Vital Signs BP 106/64   Pulse (!) 103   Temp 98.6 F (37 C) (Oral)   Resp 20   Ht 5\' 10"  (1.778 m)   Wt 86.2 kg   SpO2 96%   BMI 27.26 kg/m  Physical Exam Vitals and nursing note reviewed.  Constitutional:      Appearance: He is well-developed.     Comments: Sleeping, easily awoken  HENT:     Head: Normocephalic and atraumatic.  Cardiovascular:     Rate and Rhythm: Regular rhythm. Tachycardia present.     Heart sounds: No murmur heard. Pulmonary:     Effort: Pulmonary effort is normal. No respiratory distress.     Breath sounds: Normal breath sounds.  Abdominal:     Palpations: Abdomen is soft.     Tenderness: There is no abdominal tenderness. There is no guarding or rebound.  Musculoskeletal:        General: No swelling or tenderness.  Skin:    General: Skin is warm and dry.     Capillary Refill: Capillary refill takes less than 2 seconds.  Neurological:     Comments: Appears intoxicated.  Moves all extremities symmetrically and strongly.  Falls asleep easily but does awaken for discussions.  Psychiatric:     Comments: Unable to assess  ED Results / Procedures / Treatments   Labs (all labs ordered are listed, but only abnormal results are displayed) Labs Reviewed  COMPREHENSIVE METABOLIC PANEL - Abnormal; Notable for the following components:      Result Value   Potassium 3.4 (*)    CO2 21 (*)    Glucose, Bld 103 (*)    Calcium 8.4 (*)    All other components within normal limits  ETHANOL - Abnormal; Notable for the following components:   Alcohol, Ethyl (B) 289 (*)    All other components within normal limits  SALICYLATE LEVEL - Abnormal; Notable for the following components:   Salicylate Lvl <7.0 (*)    All other components within normal limits  ACETAMINOPHEN LEVEL - Abnormal; Notable for the following components:   Acetaminophen (Tylenol), Serum <10 (*)     All other components within normal limits  ACETAMINOPHEN LEVEL - Abnormal; Notable for the following components:   Acetaminophen (Tylenol), Serum <10 (*)    All other components within normal limits  CBC  RAPID URINE DRUG SCREEN, HOSP PERFORMED    EKG EKG Interpretation Date/Time:  Sunday December 02 2022 05:41:28 EST Ventricular Rate:  99 PR Interval:  147 QRS Duration:  87 QT Interval:  328 QTC Calculation: 421 R Axis:   101  Text Interpretation: Sinus rhythm Right axis deviation Confirmed by Tilden Fossa [phone]) on 12/02/2022 5:47:24 AM  Radiology No results found.  Procedures Procedures    Medications Ordered in ED Medications  LORazepam (ATIVAN) tablet 1 mg (has no administration in time range)  ziprasidone (GEODON) 20 MG injection (  Given 12/02/22 0115)  sterile water (preservative free) injection (  Given 12/02/22 0117)  potassium chloride SA (KLOR-CON M) CR tablet 20 mEq (20 mEq Oral Given 12/02/22 0542)    ED Course/ Medical Decision Making/ A&P                                 Medical Decision Making Amount and/or Complexity of Data Reviewed Labs: ordered.  Risk Prescription drug management.   Patient with history of bipolar disorder, anxiety here for evaluation for concern for suicide attempt with alcohol intoxication.  He was agitated at time of ED arrival and required Geodon for sedation.  IVC completed for patient's safety.  Of note patient has documented SpO2 of 82% on triage, this is an accurate.  Patient without hypoxia during his ED stay.  Unable to receive initial EMS report in person but on record review of EMS report a friend called 911 due to patient reporting suicidal ideation and he was found with pills in his mouth as well as pills scattered on the floor. He has 2 negative acetaminophen levels.  EKG without significant changes.  He has mild hypokalemia-replaced orally.  Alcohol level is significantly elevated.  On reassessment at 545 in the  emergency department patient is awake, alert.  He does not recall any of the events earlier in the evening.  He is unsure why he is in the emergency department.  Discussed with patient events that led to him arriving to the emergency department.  Patient denies any current SI.  He denies any recent SI.  Denies any prior suicide attempts.  Given severity of events that transpired earlier feel that patient would benefit from psychiatric evaluation.  TTS consulted for psychiatric recommendations.         Final Clinical Impression(s) / ED Diagnoses Final diagnoses:  Alcoholic intoxication without complication (HCC)  Suicide attempt [facility])    Rx / DC Orders ED Discharge Orders     None         Tilden Fossa, MD 12/02/22 [phone]

## 2022-12-02 NOTE — ED Notes (Signed)
[facility] called for transport at this time.

## 2022-12-02 NOTE — ED Notes (Signed)
Report called to [facility], Report given to [facility] on adult unit.

## 2022-12-02 NOTE — ED Provider Notes (Signed)
Care transferred from Dr. Madilyn Hook.  At time of transfer of care, patient is waiting for TTS evaluation and patient is under IVC.  Patient will be medically clear from a poison control standpoint at 8 AM per report.  Patient medically cleared as of 8 AM.  Await further psychiatric recommendations.   Trevor Brewer, Trevor Brim, MD 12/02/22 1310

## 2022-12-02 NOTE — Progress Notes (Signed)
   12/02/22 2215 12/02/22 2240  Vital Signs  Temp 99.6 F (37.6 C)  --   Temp Source Oral  --   Pulse Rate (!) 116 (!) 126  Pulse Rate Source Monitor Monitor  Resp 18 18  BP (!) 149/101 (!) 140/95  BP Location Left Arm Left Arm  BP Method Automatic Automatic  Patient Position (if appropriate) Sitting Standing  Oxygen Therapy  SpO2  --  99 %  O2 Device  --  Room Air   Patient ambulating with steady gait has visible tremors stated he feels very anxious. Poison Control [phone] Ref Angelique Blonder called to check on Patient V/S R/T Elevated HR. Stated Patient has not been cleared with Poison control. To continue to monitor Patient. A/C notIfied.

## 2022-12-02 NOTE — ED Notes (Signed)
Patient is speaking with friend on phone at this time.

## 2022-12-02 NOTE — ED Notes (Signed)
Went in with Dr Madilyn Hook and discussed with patient the plan for his admission. He understood that we had to watch him medically for 2 more hours and then we will have Tts see him.

## 2022-12-02 NOTE — Progress Notes (Signed)
  ADMISSION DAR NOTE:   Pt presented from [facility] under Involuntary status. Alert and oriented by 3. Pt observed with sad and anxious affect, logical speech and fair eye contact.  Reports worsening depression  and anxiety due to relationship issues. 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   Patients report being sad, poor sleep. Patient is Tacky and endorse anxiety he is shaking very tremulous. Patient stated he has been taking Vrayla, Cymbalta and Trazodone for about 4 months and Lamictal for 2 months is seen at [facility] by Dr Osie Cheeks. Patient would like to get out Patient Therapist. [place] has family history of Mental health anxiety, depression and Bipolar. Denies any abuse.   Emotional support and availability offered to Patient as needed. Skin assessment done and belongings searched per protocol. Items deemed contraband secured in locker. Unit orientation and routine discussed, Care Plan reviewed as well and Patient verbalized understanding. Fluids and Food offered, tolerated well. Q15 minutes safety checks initiated without self harm gestures.

## 2022-12-02 NOTE — ED Notes (Signed)
Registration called asking about visitor for room 34. Went and spoke with patient about said visitor [place] and he said that was fine. Let registration know that was fine. Called back and said will speak on the phone instead per charge.

## 2022-12-02 NOTE — ED Notes (Signed)
Poison control reached out at this time for update on pt. Poison control states to encourage fluids to assist with pt tachycardia but do not expect anything to get worse at this point

## 2022-12-03 ENCOUNTER — Encounter (HOSPITAL_COMMUNITY): Payer: Self-pay | Admitting: Psychiatry

## 2022-12-03 ENCOUNTER — Encounter (HOSPITAL_COMMUNITY): Payer: Self-pay

## 2022-12-03 DIAGNOSIS — F314 Bipolar disorder, current episode depressed, severe, without psychotic features: Secondary | ICD-10-CM | POA: Diagnosis not present

## 2022-12-03 DIAGNOSIS — F1020 Alcohol dependence, uncomplicated: Secondary | ICD-10-CM | POA: Diagnosis present

## 2022-12-03 MED ORDER — LORAZEPAM 1 MG PO TABS
1.0000 mg | ORAL_TABLET | Freq: Three times a day (TID) | ORAL | Status: AC
  Administered 2022-12-03 (×3): 1 mg via ORAL
  Filled 2022-12-03 (×3): qty 1

## 2022-12-03 MED ORDER — ARIPIPRAZOLE 5 MG PO TABS
5.0000 mg | ORAL_TABLET | Freq: Every day | ORAL | Status: AC
  Administered 2022-12-03 – 2022-12-04 (×2): 5 mg via ORAL
  Filled 2022-12-03 (×3): qty 1

## 2022-12-03 MED ORDER — DULOXETINE HCL 30 MG PO CPEP
30.0000 mg | ORAL_CAPSULE | Freq: Every day | ORAL | Status: DC
  Administered 2022-12-05: 30 mg via ORAL
  Filled 2022-12-03 (×2): qty 1

## 2022-12-03 MED ORDER — LORAZEPAM 0.5 MG PO TABS
0.5000 mg | ORAL_TABLET | Freq: Two times a day (BID) | ORAL | Status: AC
  Administered 2022-12-05 (×2): 0.5 mg via ORAL
  Filled 2022-12-03 (×2): qty 1

## 2022-12-03 MED ORDER — LAMOTRIGINE 25 MG PO TABS
50.0000 mg | ORAL_TABLET | Freq: Two times a day (BID) | ORAL | Status: DC
  Administered 2022-12-03 – 2022-12-05 (×5): 50 mg via ORAL
  Filled 2022-12-03 (×11): qty 2

## 2022-12-03 MED ORDER — BUSPIRONE HCL 10 MG PO TABS
10.0000 mg | ORAL_TABLET | Freq: Two times a day (BID) | ORAL | Status: DC
  Administered 2022-12-03 – 2022-12-05 (×5): 10 mg via ORAL
  Filled 2022-12-03 (×8): qty 1

## 2022-12-03 MED ORDER — DULOXETINE HCL 60 MG PO CPEP
60.0000 mg | ORAL_CAPSULE | Freq: Every day | ORAL | Status: AC
  Administered 2022-12-03 – 2022-12-04 (×2): 60 mg via ORAL
  Filled 2022-12-03 (×3): qty 1

## 2022-12-03 MED ORDER — ARIPIPRAZOLE 10 MG PO TABS
10.0000 mg | ORAL_TABLET | Freq: Every day | ORAL | Status: DC
  Administered 2022-12-05: 10 mg via ORAL
  Filled 2022-12-03 (×3): qty 1

## 2022-12-03 MED ORDER — LORAZEPAM 1 MG PO TABS
1.0000 mg | ORAL_TABLET | Freq: Two times a day (BID) | ORAL | Status: AC
  Administered 2022-12-04 (×2): 1 mg via ORAL
  Filled 2022-12-03 (×2): qty 1

## 2022-12-03 NOTE — Plan of Care (Signed)
  Problem: Education: Goal: Emotional status will improve Outcome: Progressing Goal: Mental status will improve Outcome: Progressing   

## 2022-12-03 NOTE — Group Note (Signed)
Date:  12/03/2022 Time:  11:45 PM  Group Topic/Focus:  Wrap-Up Group:   The focus of this group is to help patients review their daily goal of treatment and discuss progress on daily workbooks.    Participation Level:  Did Not Attend  Participation Quality:   N/A  Affect:   N/A  Cognitive:   N/A  Insight: None  Engagement in Group:   N/A  Modes of Intervention:   N/A  Additional Comments:  Patient did not attend wrap up group.  Kennieth Francois 12/03/2022, 11:45 PM

## 2022-12-03 NOTE — Plan of Care (Signed)

## 2022-12-03 NOTE — BHH Suicide Risk Assessment (Deleted)
[facility] Admission Suicide Risk Assessment   Nursing information obtained from:  Patient Demographic factors:  Male, [name], lesbian, or bisexual orientation Current Mental Status:  Suicidal ideation indicated by others Loss Factors:  Loss of significant relationship Historical Factors:  Impulsivity, Family history of mental illness or substance abuse Risk Reduction Factors:  Employed, Positive social support, Positive coping skills or problem solving skills  Total Time spent with patient: 1 hour Principal Problem: Bipolar disorder (HCC) Diagnosis:  Principal Problem:   Bipolar disorder (HCC) Active Problems:   GAD (generalized anxiety disorder)   Alcohol use disorder, severe, dependence (HCC)  Subjective Data: 32 years old male admitted after suicide attempt by overdosing on home psychotropic medications including Vraylar and trazodone in an attempt to harm himself, was admitted to the unit for mood stabilization and to help with depression and SI  Continued Clinical Symptoms:  Alcohol Use Disorder Identification Test Final Score (AUDIT): 19 The "Alcohol Use Disorders Identification Test", Guidelines for Use in Primary Care, Second Edition.  World Science writer [facility]). Score between 0-7:  no or low risk or alcohol related problems. Score between 8-15:  moderate risk of alcohol related problems. Score between 16-19:  high risk of alcohol related problems. Score 20 or above:  warrants further diagnostic evaluation for alcohol dependence and treatment.   CLINICAL FACTORS:   Bipolar Disorder:   Depressive phase   Musculoskeletal: Strength & Muscle Tone: within normal limits Gait & Station: normal Patient leans: N/A  Psychiatric Specialty Exam:  Presentation  General Appearance:  Appropriate for Environment  Eye Contact: Good  Speech: Clear and Coherent  Speech Volume: Normal  Handedness: Right   Mood and Affect  Mood: Anxious;  Depressed  Affect: Appropriate   Thought Process  Thought Processes: Coherent  Descriptions of Associations:Intact  Orientation:Full (Time, Place and Person)  Thought Content:WDL  History of Schizophrenia/Schizoaffective disorder:No data recorded Duration of Psychotic Symptoms:No data recorded Hallucinations:Hallucinations: None  Ideas of Reference:None  Suicidal Thoughts:Suicidal Thoughts: Yes, Passive SI Passive Intent and/or Plan: Without Plan  Homicidal Thoughts:Homicidal Thoughts: No   Sensorium  Memory: Immediate Good; Recent Good  Judgment: Poor  Insight: Fair   Chartered certified accountant: Fair  Attention Span: Fair  Recall: Good  Fund of Knowledge: Good  Language: Good   Psychomotor Activity  Psychomotor Activity: Psychomotor Activity: Normal   Assets  Assets: Communication Skills; Desire for Improvement; Housing; Health and safety inspector; Social Support; Transportation   Sleep  Sleep: Sleep: Poor    Physical Exam: Physical Exam ROS Blood pressure 132/88, pulse (!) 116, temperature 98.6 F (37 C), temperature source Oral, resp. rate 18, height 5\' 10"  (1.778 m), weight 86.1 kg, SpO2 99%. Body mass index is 27.24 kg/m.   COGNITIVE FEATURES THAT CONTRIBUTE TO RISK:  None    SUICIDE RISK:   Severe:  Frequent, intense, and enduring suicidal ideation, specific plan, no subjective intent, but some objective markers of intent (i.e., choice of lethal method), the method is accessible, some limited preparatory behavior, evidence of impaired self-control, severe dysphoria/symptomatology, multiple risk factors present, and few if any protective factors, particularly a lack of social support.  PLAN OF CARE: See H&P for detailed plan of care  I certify that inpatient services furnished can reasonably be expected to improve the patient's condition.   Shubh Chiara Abbott Pao, MD 12/03/2022, 12:37 PM

## 2022-12-03 NOTE — BHH Group Notes (Signed)
Adult Psychoeducational Group Note  Date:  12/03/2022 Time:  9:37 PM  Group Topic/Focus:  Wrap-Up Group:   The focus of this group is to help patients review their daily goal of treatment and discuss progress on daily workbooks.  Participation Level:  Active  Participation Quality:  Appropriate  Affect:  Appropriate  Cognitive:  Appropriate  Insight: Appropriate  Engagement in Group:  Engaged  Modes of Intervention:  Discussion  Additional Comments:  Pt attended the evening AA meeting.  Christ Kick 12/03/2022, 9:37 PM

## 2022-12-03 NOTE — BH IP Treatment Plan (Signed)
Interdisciplinary Treatment and Diagnostic Plan Update  12/03/2022 Time of Session: 10:45AM Trevor Brewer MRN: [id number]  Principal Diagnosis: Bipolar disorder [facility])  Secondary Diagnoses: Principal Problem:   Bipolar disorder (HCC) Active Problems:   GAD (generalized anxiety disorder)   Alcohol use disorder, severe, dependence (HCC)   Bipolar 1 disorder, depressed, severe (HCC)   Current Medications:  Current Facility-Administered Medications  Medication Dose Route Frequency Provider Last Rate Last Admin   acetaminophen (TYLENOL) tablet 650 mg  650 mg Oral Q6H PRN Motley-Mangrum, Jadeka A, PMHNP       alum & mag hydroxide-simeth (MAALOX/MYLANTA) 200-200-20 MG/5ML suspension 30 mL  30 mL Oral Q4H PRN Motley-Mangrum, Jadeka A, PMHNP       ARIPiprazole (ABILIFY) tablet 5 mg  5 mg Oral Daily Attiah, Nadir, MD       Followed by   Melene Muller ON 12/05/2022] ARIPiprazole (ABILIFY) tablet 10 mg  10 mg Oral Daily Attiah, Nadir, MD       busPIRone (BUSPAR) tablet 10 mg  10 mg Oral BID Abbott Pao, Nadir, MD       diphenhydrAMINE (BENADRYL) capsule 50 mg  50 mg Oral TID PRN Motley-Mangrum, Jadeka A, PMHNP       Or   diphenhydrAMINE (BENADRYL) injection 50 mg  50 mg Intramuscular TID PRN Motley-Mangrum, Jadeka A, PMHNP       DULoxetine (CYMBALTA) DR capsule 60 mg  60 mg Oral Daily Attiah, Nadir, MD       Followed by   Melene Muller ON 12/05/2022] DULoxetine (CYMBALTA) DR capsule 30 mg  30 mg Oral Daily Attiah, Nadir, MD       haloperidol (HALDOL) tablet 5 mg  5 mg Oral TID PRN Motley-Mangrum, Jadeka A, PMHNP       Or   haloperidol lactate (HALDOL) injection 5 mg  5 mg Intramuscular TID PRN Motley-Mangrum, Jadeka A, PMHNP       hydrOXYzine (ATARAX) tablet 25 mg  25 mg Oral TID PRN Motley-Mangrum, Jadeka A, PMHNP       lamoTRIgine (LAMICTAL) tablet 50 mg  50 mg Oral BID Attiah, Nadir, MD       loperamide (IMODIUM) capsule 2-4 mg  2-4 mg Oral PRN Motley-Mangrum, Jadeka A, PMHNP       LORazepam (ATIVAN) tablet 2  mg  2 mg Oral TID PRN Motley-Mangrum, Jadeka A, PMHNP       Or   LORazepam (ATIVAN) injection 2 mg  2 mg Intramuscular TID PRN Motley-Mangrum, Jadeka A, PMHNP       LORazepam (ATIVAN) tablet 1 mg  1 mg Oral TID Abbott Pao, Nadir, MD   1 mg at 12/03/22 1213   Followed by   Melene Muller ON 12/04/2022] LORazepam (ATIVAN) tablet 1 mg  1 mg Oral BID Abbott Pao, Nadir, MD       Followed by   Melene Muller ON 12/05/2022] LORazepam (ATIVAN) tablet 0.5 mg  0.5 mg Oral BID Abbott Pao, Nadir, MD       LORazepam (ATIVAN) tablet 1 mg  1 mg Oral Q6H PRN Motley-Mangrum, Jadeka A, PMHNP   1 mg at 12/02/22 2303   magnesium hydroxide (MILK OF MAGNESIA) suspension 30 mL  30 mL Oral Daily PRN Motley-Mangrum, Jadeka A, PMHNP       multivitamin with minerals tablet 1 tablet  1 tablet Oral Daily Motley-Mangrum, Jadeka A, PMHNP   1 tablet at 12/03/22 0829   nicotine (NICODERM CQ - dosed in mg/24 hours) patch 14 mg  14 mg Transdermal Daily Bobbitt, Shalon E, NP   14  mg at 12/03/22 0829   ondansetron (ZOFRAN-ODT) disintegrating tablet 4 mg  4 mg Oral Q6H PRN Motley-Mangrum, Jadeka A, PMHNP       thiamine (Vitamin B-1) tablet 100 mg  100 mg Oral Daily Motley-Mangrum, Jadeka A, PMHNP   100 mg at 12/03/22 0829   traZODone (DESYREL) tablet 50 mg  50 mg Oral QHS PRN Motley-Mangrum, Jadeka A, PMHNP   50 mg at 12/02/22 2303   PTA Medications: Medications Prior to Admission  Medication Sig Dispense Refill Last Dose   busPIRone (BUSPAR) 10 MG tablet Take 10 mg by mouth 2 (two) times daily. (Patient not taking: Reported on 12/02/2022)      DULoxetine (CYMBALTA) 60 MG capsule Take 120 mg by mouth daily.      famotidine (PEPCID) 10 MG tablet Take 20-30 mg by mouth as needed for heartburn or indigestion.      lamoTRIgine (LAMICTAL) 25 MG tablet Take 50 mg by mouth daily.      traZODone (DESYREL) 50 MG tablet Take 50 mg by mouth at bedtime as needed for sleep.      VRAYLAR 3 MG capsule Take 3 mg by mouth daily.      WEGOVY 2.4 MG/0.75ML SOAJ Inject 2.4 mg  into the skin once a week.      ZEPBOUND 5 MG/0.5ML Pen Inject into the skin. (Patient not taking: Reported on 12/02/2022)       Patient Stressors: Educational concerns   Marital or family conflict   Substance abuse    Patient Strengths: Average or above average intelligence  Capable of independent living  Communication skills  General fund of knowledge  Motivation for treatment/growth  Supportive family/friends   Treatment Modalities: Medication Management, Group therapy, Case management,  1 to 1 session with clinician, Psychoeducation, Recreational therapy.   Physician Treatment Plan for Primary Diagnosis: Bipolar disorder (HCC) Long Term Goal(s):     Short Term Goals: Ability to identify changes in lifestyle to reduce recurrence of condition will improve Ability to verbalize feelings will improve Ability to disclose and discuss suicidal ideas Ability to demonstrate self-control will improve Ability to identify and develop effective coping behaviors will improve Ability to maintain clinical measurements within normal limits will improve Compliance with prescribed medications will improve Ability to identify triggers associated with substance abuse/mental health issues will improve  Medication Management: Evaluate patient's response, side effects, and tolerance of medication regimen.  Therapeutic Interventions: 1 to 1 sessions, Unit Group sessions and Medication administration.  Evaluation of Outcomes: Not Progressing  Physician Treatment Plan for Secondary Diagnosis: Principal Problem:   Bipolar disorder (HCC) Active Problems:   GAD (generalized anxiety disorder)   Alcohol use disorder, severe, dependence (HCC)   Bipolar 1 disorder, depressed, severe (HCC)  Long Term Goal(s):     Short Term Goals: Ability to identify changes in lifestyle to reduce recurrence of condition will improve Ability to verbalize feelings will improve Ability to disclose and discuss suicidal  ideas Ability to demonstrate self-control will improve Ability to identify and develop effective coping behaviors will improve Ability to maintain clinical measurements within normal limits will improve Compliance with prescribed medications will improve Ability to identify triggers associated with substance abuse/mental health issues will improve     Medication Management: Evaluate patient's response, side effects, and tolerance of medication regimen.  Therapeutic Interventions: 1 to 1 sessions, Unit Group sessions and Medication administration.  Evaluation of Outcomes: Not Progressing   RN Treatment Plan for Primary Diagnosis: Bipolar disorder (HCC) Long Term Goal(s):  Knowledge of disease and therapeutic regimen to maintain health will improve  Short Term Goals: Ability to remain free from injury will improve, Ability to verbalize frustration and anger appropriately will improve, Ability to demonstrate self-control, Ability to participate in decision making will improve, Ability to verbalize feelings will improve, Ability to disclose and discuss suicidal ideas, Ability to identify and develop effective coping behaviors will improve, and Compliance with prescribed medications will improve  Medication Management: RN will administer medications as ordered by provider, will assess and evaluate patient's response and provide education to patient for prescribed medication. RN will report any adverse and/or side effects to prescribing provider.  Therapeutic Interventions: 1 on 1 counseling sessions, Psychoeducation, Medication administration, Evaluate responses to treatment, Monitor vital signs and CBGs as ordered, Perform/monitor CIWA, COWS, AIMS and Fall Risk screenings as ordered, Perform wound care treatments as ordered.  Evaluation of Outcomes: Not Progressing   LCSW Treatment Plan for Primary Diagnosis: Bipolar disorder [facility]) Long Term Goal(s): Safe transition to appropriate next level of  care at discharge, Engage patient in therapeutic group addressing interpersonal concerns.  Short Term Goals: Engage patient in aftercare planning with referrals and resources, Increase social support, Increase ability to appropriately verbalize feelings, Increase emotional regulation, Facilitate acceptance of mental health diagnosis and concerns, Facilitate patient progression through stages of change regarding substance use diagnoses and concerns, Identify triggers associated with mental health/substance abuse issues, and Increase skills for wellness and recovery  Therapeutic Interventions: Assess for all discharge needs, 1 to 1 time with Social worker, Explore available resources and support systems, Assess for adequacy in community support network, Educate family and significant other(s) on suicide prevention, Complete Psychosocial Assessment, Interpersonal group therapy.  Evaluation of Outcomes: Not Progressing   Progress in Treatment: Attending groups: No. Participating in groups: No. Taking medication as prescribed: Yes. Toleration medication: Yes. Family/Significant other contact made: No, will contact:  Consent pending Patient understands diagnosis: Yes. Discussing patient identified problems/goals with staff: Yes. Medical problems stabilized or resolved: Yes. Denies suicidal/homicidal ideation: Yes. Issues/concerns per patient self-inventory: No.  New problem(s) identified: No, Describe:  none reported  New Short Term/Long Term Goal(s): detox, medication management for mood stabilization; elimination of SI thoughts; development of comprehensive mental wellness/sobriety plan   Patient Goals:  "Trying to withdrawal and get connected with a therapist or intensive outpatient program for my substance use"  Discharge Plan or Barriers: Patient recently admitted. CSW will continue to follow and assess for appropriate referrals and possible discharge planning.    Reason for  Continuation of Hospitalization: Depression Medication stabilization Suicidal ideation Withdrawal symptoms  Estimated Length of Stay: 5-7 days  Last 3 [place] Suicide Severity Risk Score: Flowsheet Row [facility]Current[facility] Most recent reading at 12/02/2022 10:45 PM ED from 12/02/2022 in [facility] Emergency Department at [facility] Most recent reading at 12/02/2022 12:58 AM  C-SSRS RISK CATEGORY High Risk No Risk       Last PHQ 2/9 Scores:    04/26/2016   11:14 AM 05/05/2015   10:53 AM  Depression screen PHQ 2/9  Decreased Interest 0 0  Down, Depressed, Hopeless 0 0  PHQ - 2 Score 0 0    Scribe for Treatment Team: Esmeralda Arthur 12/03/2022 12:42 PM

## 2022-12-03 NOTE — H&P (Signed)
Psychiatric Admission Assessment Adult  Patient Identification: Trevor Brewer MRN:  [id number] Date of Evaluation:  12/03/2022  Chief Complaint:  Bipolar disorder (HCC) [F31.9],  Bipolar disorder (HCC)  Principal Problem:   Bipolar disorder (HCC) Active Problems:   GAD (generalized anxiety disorder)   Alcohol dependency (HCC)   History of Present Illness:  Trevor Brewer is a 32 y.o., male with a past psychiatric history significant for bipolar disorder and anxiety who presents to the [facility]untary from [facility] Emergency Department for evaluation and management of suicide attempt with alcohol intoxication.   Initial assessment on 12/03/2022, patient was evaluated on the inpatient unit. The patient reports that this all started after both he and the person he is currently romantically interested in were at the patient's house drinking together 2 nights ago. They were both drinking tequila when they got into an argument, and the other individual left the patient's home. This caused the patient to "spiral" and overdose on his psychiatric medications: vraylar and trazodone. He cannot remember what the argument was about, or how many pills he took. He states that he believes he took more vraylar than trazodone, but states that the individual with him removed many pills out of his hand while they were arguing. 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 He states that everything became too much in the moment and he felt out of control. However, once he was brought to the hospital and sobered up, the patient admits that these feelings went away and now he is only worried about what the other individual now thinks of him. He reports that "I never should have done it." He believes that  in the moment, he admits to not necessarily wanting to die, but rather exhibiting attention seeking behavior. He states "I have accomplished so much in my life, I would have been so upset with myself if I had actually done it."  The patient reports that he has struggled with depression and anxiety for many years, although he believes that it has recently been well controlled. He follows closely with a psychiatric nurse practitioner who just recently increased his lamictal dosage to 50 mg 2 months ago, and also follows up with a personal therapist through [facility]. He also states that he is not usually more depressed when he drinks and usually feels in full control of his emotions. He denies ever having SI before, or ever attempting suicide, and states that "this was a totally isolated event. I really don't know what happened."  Psychiatric ROS Mood Symptoms Positive: persistent sadness or low mood; low energy Negative: loss of interest or pleasure in activities (anhedonia); significant weight change or appetite disturbance; sleep disturbances-number of hours:  difficulty falling asleep, difficulty staying asleep; feelings of hopelessness or excessive guilt; recurrent thoughts of death or suicide  Anxiety Symptoms Anxiety, mainly related to school, work, and worry over his new romantic interest Panic attacks: denies  Manic Symptoms Reports multiple days of consistently elevated mood and/or energy outside the context of any substance use; distractibility; impulse behavior - excessive spending; increased activity-, minimal sleep: number of hours-2-3; increased amount of speech  Frequency: a few times per year Most recent episode: 2 months ago  Psychosis Symptoms AVH: denies  Trauma Symptoms Exposure to abuse: denies  Psychiatric medications prior  to admission: cymbalta 120 mg, vraylar 3 mg, lamictal 50 mg, trazodone 50 mg  Past Psychiatric History:  Previous Psych Diagnoses: bipolar  disorder, anxiety, depression Prior psychiatric treatment: buspar, atarax (stopped due to concern of polypharmacy) Psychiatric medication compliance history: reports good compliance  Current psychiatric treatment: cymbalta 120 mg, vraylar 3 mg, lamictal 50 mg, trazodone 50 mg Current psychiatrist: Psych NP at [facility] in [facility], last appt was 11/23/2022 Current therapist: "TJ" with [facility] ([facility])  Previous hospitalizations none History of suicide attempts: denies History of self harm: denies  Substance Use History: Alcohol: drinks alcohol from Thursday-Saturday of each week, averaging around 0.5 bottles of tequila per day, equalling 1.5 bottles total over 3 days, reports daily drinking in college with increased amount Hx withdrawal tremors/shakes: in college Hx alcoholic seizures: denies DUI: denies  --------  Tobacco: denies Marijuana: denies Cocaine: denies Methamphetamines: denies Ecstasy: denies Opiates: denies Benzodiazepines: denies Prescribed Meds abuse: denies  History of Detox / Rehab: denies  Is the patient at risk to self? No Has the patient been a risk to self in the past 6 months? Yes Has the patient been a risk to self within the distant past? No Is the patient a risk to others? No Has the patient been a risk to others in the past 6 months? No Has the patient been a risk to others within the distant past? No  Alcohol Screening: Patient refused Alcohol Screening Tool: Yes 1. How often do you have a drink containing alcohol?: 4 or more times a week 2. How many drinks containing alcohol do you have on a typical day when you are drinking?: 5 or 6 3. How often do you have six or more drinks on one occasion?: Weekly AUDIT-C Score: 9 4. How often during the last year have you found that you were not able to stop drinking once you had started?: Less than monthly 5. How often during the last year have you failed to do what was normally expected from  you because of drinking?: Less than monthly 6. How often during the last year have you needed a first drink in the morning to get yourself going after a heavy drinking session?: Never 7. How often during the last year have you had a feeling of guilt of remorse after drinking?: Monthly 8. How often during the last year have you been unable to remember what happened the night before because you had been drinking?: Monthly 9. Have you or someone else been injured as a result of your drinking?: No 10. Has a relative or friend or a doctor or another health worker been concerned about your drinking or suggested you cut down?: Yes, during the last year Alcohol Use Disorder Identification Test Final Score (AUDIT): 19 Alcohol Brief Interventions/Follow-up: Alcohol education/Brief advice Tobacco Screening:    Substance Abuse History in the last 12 months: Yes  Past Medical/Surgical History:  Medical Diagnoses: pre-diabetic Home Rx: wegovy IM  Head trauma: denies Seizures: denies  Allergies: patient endorses no known allergies to medications  Family History:  Medical: diabetes in paternal uncle, HTN in paternal uncle Psych: alcohol use in father, depression and anxiety in mother and father (per patient, undiagnosed) Suicide: denies Substance use family hx: alcohol use in father  Social History:  Abuse: denies Marital Status: single Children: none 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 [employer] for the past year Housing: lives alone, apartment  Legal: denies Military: denies Weapons: denies   Lab Results:  Results for orders placed or performed during the hospital encounter of 12/02/22 (from the past 48 hour(s))  Comprehensive metabolic panel     Status: Abnormal   Collection Time: 12/02/22  1:55 AM  Result Value Ref Range   Sodium 140 135 - 145 mmol/L   Potassium 3.4 (L) 3.5 - 5.1 mmol/L   Chloride 106  98 - 111 mmol/L   CO2 21 (L) 22 - 32 mmol/L   Glucose, Bld 103 (H) 70 - 99 mg/dL    Comment: Glucose reference range applies only to samples taken after fasting for at least 8 hours.   BUN 9 6 - 20 mg/dL   Creatinine, Ser 0.86 0.61 - 1.24 mg/dL   Calcium 8.4 (L) 8.9 - 10.3 mg/dL   Total Protein 8.0 6.5 - 8.1 g/dL   Albumin 4.2 3.5 - 5.0 g/dL   AST 19 15 - 41 U/L   ALT 21 0 - 44 U/L   Alkaline Phosphatase 55 38 - 126 U/L   Total Bilirubin 0.3 0.3 - 1.2 mg/dL   GFR, Estimated >57 >84 mL/min    Comment: (NOTE) Calculated using the CKD-EPI Creatinine Equation (2021)    Anion gap 13 5 - 15    Comment: Performed at [facility] [place] [facility], [address], [place], [place] [address]  Ethanol     Status: Abnormal   Collection Time: 12/02/22  1:55 AM  Result Value Ref Range   Alcohol, Ethyl (B) 289 (H) <10 mg/dL    Comment: (NOTE) Lowest detectable limit for serum alcohol is 10 mg/dL.  For medical purposes only. Performed at [facility], [address], [place], [place] [address]   Salicylate level     Status: Abnormal   Collection Time: 12/02/22  1:55 AM  Result Value Ref Range   Salicylate Lvl <7.0 (L) 7.0 - 30.0 mg/dL    Comment: Performed at [facility], [address], [place], [place] [address]  Acetaminophen level     Status: Abnormal   Collection Time: 12/02/22  1:55 AM  Result Value Ref Range   Acetaminophen (Tylenol), Serum <10 (L) 10 - 30 ug/mL    Comment: (NOTE) Therapeutic concentrations vary significantly. A range of 10-30 ug/mL  may be an effective concentration for many patients. However, some  are best treated at concentrations outside of this range. Acetaminophen concentrations >150 ug/mL at 4 hours after ingestion  and >50 ug/mL at 12 hours after ingestion are often associated with  toxic reactions.  Performed at [facility], [address], [place], [place] [address]   cbc     Status: None    Collection Time: 12/02/22  1:55 AM  Result Value Ref Range   WBC 8.7 4.0 - 10.5 K/uL   RBC 5.55 4.22 - 5.81 MIL/uL   Hemoglobin 15.9 13.0 - 17.0 g/dL   HCT 53.6 64.4 - 03.4 %   MCV 87.6 80.0 - 100.0 fL   MCH 28.6 26.0 - 34.0 pg   MCHC 32.7 30.0 - 36.0 g/dL   RDW 74.2 59.5 - 63.8 %   Platelets 354 150 - 400 K/uL   nRBC 0.0 0.0 - 0.2 %    Comment: Performed at [facility], [address] [place], [place] [address]  Acetaminophen level     Status: Abnormal   Collection Time: 12/02/22  4:24 AM  Result Value Ref Range  Acetaminophen (Tylenol), Serum <10 (L) 10 - 30 ug/mL    Comment: (NOTE) Therapeutic concentrations vary significantly. A range of 10-30 ug/mL  may be an effective concentration for many patients. However, some  are best treated at concentrations outside of this range. Acetaminophen concentrations >150 ug/mL at 4 hours after ingestion  and >50 ug/mL at 12 hours after ingestion are often associated with  toxic reactions.  Performed at [facility], [address] [place], [place] [address]   Rapid urine drug screen (hospital performed)     Status: None   Collection Time: 12/02/22  8:50 AM  Result Value Ref Range   Opiates NONE DETECTED NONE DETECTED   Cocaine NONE DETECTED NONE DETECTED   Benzodiazepines NONE DETECTED NONE DETECTED   Amphetamines NONE DETECTED NONE DETECTED   Tetrahydrocannabinol NONE DETECTED NONE DETECTED   Barbiturates NONE DETECTED NONE DETECTED    Comment: (NOTE) DRUG SCREEN FOR MEDICAL PURPOSES ONLY.  IF CONFIRMATION IS NEEDED FOR ANY PURPOSE, NOTIFY LAB WITHIN 5 DAYS.  LOWEST DETECTABLE LIMITS FOR URINE DRUG SCREEN Drug Class                     Cutoff (ng/mL) Amphetamine and metabolites    1000 Barbiturate and metabolites    200 Benzodiazepine                 200 Opiates and metabolites        300 Cocaine and metabolites        300 THC                            50 Performed at [facility], [address] [place], [place] [address]     Blood Alcohol level:  Lab Results  Component Value Date   ETH 289 (H) 12/02/2022    Metabolic Disorder Labs:  Lab Results  Component Value Date   HGBA1C 5.4 02/22/2012   No results found for: "PROLACTIN" Lab Results  Component Value Date   CHOL 123 02/22/2012   TRIG 67.0 02/22/2012   HDL 33.60 (L) 02/22/2012   CHOLHDL 4 02/22/2012   VLDL 13.4 02/22/2012   LDLCALC 76 02/22/2012    Current Medications: Current Facility-Administered Medications  Medication Dose Route Frequency Provider Last Rate Last Admin   acetaminophen (TYLENOL) tablet 650 mg  650 mg Oral Q6H PRN Motley-Mangrum, Jadeka A, PMHNP       alum & mag hydroxide-simeth (MAALOX/MYLANTA) 200-200-20 MG/5ML suspension 30 mL  30 mL Oral Q4H PRN Motley-Mangrum, Jadeka A, PMHNP       diphenhydrAMINE (BENADRYL) capsule 50 mg  50 mg Oral TID PRN Motley-Mangrum, Jadeka A, PMHNP       Or   diphenhydrAMINE (BENADRYL) injection 50 mg  50 mg Intramuscular TID PRN Motley-Mangrum, Jadeka A, PMHNP       haloperidol (HALDOL) tablet 5 mg  5 mg Oral TID PRN Motley-Mangrum, Jadeka A, PMHNP       Or   haloperidol lactate (HALDOL) injection 5 mg  5 mg Intramuscular TID PRN Motley-Mangrum, Jadeka A, PMHNP       hydrOXYzine (ATARAX) tablet 25 mg  25 mg Oral TID PRN Motley-Mangrum, Jadeka A, PMHNP       loperamide (IMODIUM) capsule 2-4 mg  2-4 mg Oral PRN Motley-Mangrum, Jadeka A, PMHNP       LORazepam (ATIVAN) tablet 2 mg  2 mg Oral TID PRN Motley-Mangrum, Jadeka A,  PMHNP       Or   LORazepam (ATIVAN) injection 2 mg  2 mg Intramuscular TID PRN Motley-Mangrum, Geralynn Ochs A, PMHNP       LORazepam (ATIVAN) tablet 1 mg  1 mg Oral TID Abbott Pao, Nadir, MD   1 mg at 12/03/22 0829   Followed by   Melene Muller ON 12/04/2022] LORazepam (ATIVAN) tablet 1 mg  1 mg Oral BID Abbott Pao, Nadir, MD       Followed by   Melene Muller ON 12/05/2022] LORazepam (ATIVAN) tablet 0.5 mg  0.5 mg Oral BID Abbott Pao,  Nadir, MD       LORazepam (ATIVAN) tablet 1 mg  1 mg Oral Q6H PRN Motley-Mangrum, Jadeka A, PMHNP   1 mg at 12/02/22 2303   magnesium hydroxide (MILK OF MAGNESIA) suspension 30 mL  30 mL Oral Daily PRN Motley-Mangrum, Jadeka A, PMHNP       multivitamin with minerals tablet 1 tablet  1 tablet Oral Daily Motley-Mangrum, Jadeka A, PMHNP   1 tablet at 12/03/22 0829   nicotine (NICODERM CQ - dosed in mg/24 hours) patch 14 mg  14 mg Transdermal Daily Bobbitt, Shalon E, NP   14 mg at 12/03/22 0829   ondansetron (ZOFRAN-ODT) disintegrating tablet 4 mg  4 mg Oral Q6H PRN Motley-Mangrum, Jadeka A, PMHNP       thiamine (Vitamin B-1) tablet 100 mg  100 mg Oral Daily Motley-Mangrum, Jadeka A, PMHNP   100 mg at 12/03/22 0829   traZODone (DESYREL) tablet 50 mg  50 mg Oral QHS PRN Motley-Mangrum, Jadeka A, PMHNP   50 mg at 12/02/22 2303    PTA Medications: Medications Prior to Admission  Medication Sig Dispense Refill Last Dose   busPIRone (BUSPAR) 10 MG tablet Take 10 mg by mouth 2 (two) times daily. (Patient not taking: Reported on 12/02/2022)      DULoxetine (CYMBALTA) 60 MG capsule Take 120 mg by mouth daily.      famotidine (PEPCID) 10 MG tablet Take 20-30 mg by mouth as needed for heartburn or indigestion.      lamoTRIgine (LAMICTAL) 25 MG tablet Take 50 mg by mouth daily.      traZODone (DESYREL) 50 MG tablet Take 50 mg by mouth at bedtime as needed for sleep.      VRAYLAR 3 MG capsule Take 3 mg by mouth daily.      WEGOVY 2.4 MG/0.75ML SOAJ Inject 2.4 mg into the skin once a week.      ZEPBOUND 5 MG/0.5ML Pen Inject into the skin. (Patient not taking: Reported on 12/02/2022)        Physical Findings: AIMS: No  CIWA:  CIWA-Ar Total: 7 COWS:     Mental Status Exam  General Appearance: appears at stated age, casually dressed and groomed, tired-appearing  Behavior: pleasant and cooperative, yawning frequently  Psychomotor Activity: bilateral fine tremor noted  Eye Contact: fair  Speech:  normal amount, tone, volume and fluency    Mood: "I'm fine" Affect: congruent, pleasant and interactive   Thought Process: linear, goal directed, no circumstantial or tangential thought process noted, no racing thoughts or flight of ideas  Descriptions of Associations: intact   Thought Content Hallucinations: denies AH, VH , does not appear responding to stimuli  Delusions: no paranoia, delusions of control, grandeur, ideas of reference, thought broadcasting, and magical thinking  Suicidal Thoughts: denies SI, intention, plan  Homicidal Thoughts: denies HI, intention, plan   Alertness/Orientation: alert and fully oriented   Insight: limited Judgment: limited  Memory:  intact   Executive Functions  Concentration: intact  Attention Span: fair  Recall: intact  Fund of Knowledge: fair   Physical Exam  Constitutional:      Appearance: Normal appearance.  Pulmonary:     Effort: Pulmonary effort is normal.  Neurological:     General: No focal deficit present.     Mental Status: Alert and oriented to person, place, and time.    Review of Systems  No reported symptoms  Blood pressure 132/88, pulse (!) 116, temperature 98.6 F (37 C), temperature source Oral, resp. rate 18, height 5\' 10"  (1.778 m), weight 86.1 kg, SpO2 99%. Body mass index is 27.24 kg/m.   Treatment Plan Summary: Daily contact with patient to assess and evaluate symptoms and progress in treatment and medication management  ASSESSMENT: Principal Problem:   Bipolar disorder (HCC) Active Problems:   GAD (generalized anxiety disorder)   Alcohol dependency (HCC)  Trevor Brewer is a 32 y.o., male with a past psychiatric history significant for bipolar disorder and anxiety who presents to the [facility] Involuntary from [facility] Emergency Department for evaluation and management of suicide attempt with alcohol intoxication.   Patient's presentation of intermittent dysphoria, low energy,  anxiety and episodes of elevated mood, decreased sleep, and impulsive spending, is consistent with Bipolar Disorder, current depressive phase in the setting of severe alcohol dependency. Patient has extensive drinking history with current use of 1.5 bottles of tequila per week in 3-day binging episodes, and had a first-time suicide attempt while intoxicated with a BAL of 289 on admission. Patient's last lamictal increase was 50 mg once daily 2 months ago; we will increase this to 50 mg twice daily for improved mood stability. Due to recent overdose attempt, we will switch patient's vraylar to abilify 5mg  with plans to increase to 10 mg in 2 days. Due to most recent manic episode being 2 months ago, and patient's persistent depression, we will decrease cymbalta to 60 mg today with plans to titrate down and eventually discontinue entirely. We will restart buspar 10 mg BID per patient request. We will also restart the patient's home medication of trazodone 50 mg for sleep due to low suspicion for current suicidal risk, and patient report of mainly taking vraylar rather than trazodone in his OD attempt. We will also start an Ativan taper at this time for alcohol withdrawals with CIWA scoring, as well as order a TSH, A1c, lipid panel, and repeat CMP. We will continue to monitor patient's depression symptoms and adjust medications as necessary. Patient is interested in [place] upon discharge.    PLAN: Safety and Monitoring:  -- Involuntary admission to inpatient psychiatric unit for safety, stabilization and treatment  -- Daily contact with patient to assess and evaluate symptoms and progress in treatment  -- Patient's case to be discussed in multi-disciplinary team meeting  -- Observation Level : q15 minute checks  -- Vital signs:  q12 hours  -- Precautions: suicide, elopement, and assault  2. Medications:    Psychiatric Diagnosis and Treatment  # Bipolar Disorder - Current phase: depressive - Start  Abilify 5 mg daily for depression and anxiety, with plans to increase to 10 mg on 11/6 - Start Lamictal 50 mg BID for mood stabilization and manic episodes - Start Cymbalta 60 mg daily to continue home medication, with plans to decrease to 30 mg on 11/6, and discontinue on 11/8 for concern of manic episodes and not effective for treating depression - Start trazodone 50 mg prn  nightly for sleep and low suspicion for suicidal risk - Discontinue Vraylar 3 mg due to recent OD attempt  # Alcohol Dependency - chronic, severe - Start CIWA protocol, for CIWA < 10 ativan Q6H - Start Ativan taper: 1 mg TID --> 0.5 mg BID --> every day - Start Thiamine (B1) tablet 100 mg daily  - Plan for [facility] upon discharge  # Generalized anxiety disorder - Start Buspar 10 mg BID for anxiety  Atarax 25 mg TID as needed for anxiety Agitation Protocol:   Medical Diagnosis and Treatment  # Pre-diabetic - Order repeat CMP for 11/5 AM for borderline potassium (3.4) - Order TSH, lipid panel, A1c - Offered that patient could get his wegovy from home to continue course, patient declined due to switching to zepbound and choosing to wait until discharge to continue  Patient in need of nicotine replacement; nicotine patch 14 mg / 24 hours ordered. Smoking cessation encouraged  Other as needed medications  Tylenol 650 mg every 6 hours as needed for pain Mylanta 30 mL every 4 hours as needed for indigestion Milk of magnesia 30 mL daily as needed for constipation    The risks/benefits/side-effects/alternatives to the above medication were discussed in detail with the patient and time was given for questions. The patient consents to medication trial. FDA black box warnings, if present, were discussed.  The patient is agreeable with the medication plan, as above. We will monitor the patient's response to pharmacologic treatment, and adjust medications as necessary.  3. Routine and other pertinent labs: EKG monitoring:  12/02/2022 QTc: 421  Metabolism / endocrine: BMI: Body mass index is 27.24 kg/m.  CBC: WNL CMP: 3.4 K, 21 CO2, 103 glucose, 8.4 calcium, all else WNL UDS: negative Ethanol: 289   4. Group Therapy:  -- Encouraged patient to participate in unit milieu and in scheduled group therapies   -- Short Term Goals: Ability to identify changes in lifestyle to reduce recurrence of condition will improve, Ability to verbalize feelings will improve, Ability to disclose and discuss suicidal ideas, Ability to demonstrate self-control will improve, Ability to identify and develop effective coping behaviors will improve, Ability to maintain clinical measurements within normal limits will improve, Compliance with prescribed medications will improve, and Ability to identify triggers associated with substance abuse/mental health issues will improve  -- Long Term Goals: Improvement in symptoms so as ready for discharge -- Patient is encouraged to participate in group therapy while admitted to the psychiatric unit. -- We will address other chronic and acute stressors, which contributed to the patient's Bipolar disorder (HCC) in order to reduce the risk of self-harm at discharge.  5. Discharge Planning:   -- Social work and case management to assist with discharge planning and identification of hospital follow-up needs prior to discharge  -- Estimated LOS: 5-7 days  -- Discharge Concerns: Need to establish a safety plan; Medication compliance and effectiveness  -- Discharge Goals: Return home with outpatient referrals for mental health follow-up including medication management/psychotherapy  I certify that inpatient services furnished can reasonably be expected to improve the patient's condition.    I discussed my assessment, planned testing and intervention for the patient with Dr. Abbott Pao who agrees with my formulated course of action.   Carolan Shiver, Medical Student, 11/4/202411:14 AM

## 2022-12-03 NOTE — Progress Notes (Signed)
   12/03/22 2052  Psych Admission Type (Psych Patients Only)  Admission Status Involuntary  Psychosocial Assessment  Patient Complaints Anxiety  Eye Contact Brief  Facial Expression Anxious;Flat  Affect Anxious;Depressed  Speech Soft  Interaction Cautious;Guarded  Motor Activity Fidgety  Appearance/Hygiene In scrubs  Behavior Characteristics Anxious  Mood Depressed;Anxious  Thought Process  Coherency WDL  Content WDL  Delusions None reported or observed  Perception WDL  Hallucination None reported or observed  Judgment Impaired  Confusion None  Danger to Self  Current suicidal ideation? Denies  Agreement Not to Harm Self Yes  Description of Agreement verbal  Danger to Others  Danger to Others None reported or observed
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
## 2022-12-03 NOTE — Group Note (Unsigned)
Date:  12/03/2022 Time:  11:22 AM  Group Topic/Focus:  Goals Group:   The focus of this group is to help patients establish daily goals to achieve during treatment and discuss how the patient can incorporate goal setting into their daily lives to aide in recovery.     Participation Level:  {B[facility] PARTICIPATION ZOXWR:60454}  Participation Quality:  {B[facility] PARTICIPATION QUALITY:22265}  Affect:  {BHH AFFECT:22266}  Cognitive:  {BHH COGNITIVE:22267}  Insight: {BHH Insight2:20797}  Engagement in Group:  {BHH ENGAGEMENT IN UJWJX:91478}  Modes of Intervention:  {BHH MODES OF INTERVENTION:22269}  Additional Comments:  ***  Reymundo Poll 12/03/2022, 11:22 AM

## 2022-12-03 NOTE — Group Note (Signed)
Recreation Therapy Group Note   Group Topic:Team Building  Group Date: 12/03/2022 Start Time: 0943 End Time: 1018 Facilitators: Rehman Levinson-McCall, LRT,CTRS Location: [address] Dayroom   Group Topic: Communication, Team Building, Problem Solving  Goal Area(s) Addresses:  Patient will effectively work with peer towards shared goal.  Patient will identify skills used to make activity successful.  Patient will identify how skills used during activity can be applied to reach post d/c goals.   Intervention: STEM Activity- Glass blower/designer  Group Description: Tallest Pharmacist, community. In teams of 5-6, patients were given 11 craft pipe cleaners. Using the materials provided, patients were instructed to compete again the opposing team(s) to build the tallest free-standing structure from floor level. The activity was timed; difficulty increased by Clinical research associate as Production designer, theatre/television/film continued.  Systematically resources were removed with additional directions for example, placing one arm behind their back, working in silence, and shape stipulations. LRT facilitated post-activity discussion reviewing team processes and necessary communication skills involved in completion. Patients were encouraged to reflect how the skills utilized, or not utilized, in this activity can be incorporated to positively impact support systems post discharge.  Education: Pharmacist, community, Scientist, physiological, Discharge Planning   Education Outcome: Acknowledges education/In group clarification offered/Needs additional education.   Affect/Mood: N/A   Participation Level: Did not attend    Clinical Observations/Individualized Feedback:     Plan: Continue to engage patient in RT group sessions 2-3x/week.   Trevor Brewer, LRT,CTRS 12/03/2022 12:26 PM

## 2022-12-03 NOTE — Progress Notes (Signed)
   12/03/22 1000  Psych Admission Type (Psych Patients Only)  Admission Status Involuntary  Psychosocial Assessment  Patient Complaints Worrying;Anxiety  Eye Contact Brief  Facial Expression Sad;Flat  Affect Anxious;Depressed  Speech Soft  Interaction Guarded  Motor Activity Fidgety  Appearance/Hygiene In scrubs  Behavior Characteristics Anxious  Mood Depressed;Anxious;Sad  Thought Process  Coherency WDL  Content WDL  Delusions None reported or observed  Perception WDL  Hallucination None reported or observed  Judgment Impaired  Confusion None  Danger to Self  Current suicidal ideation? Denies  Agreement Not to Harm Self Yes  Description of Agreement verbal  Danger to Others  Danger to Others None reported or observed

## 2022-12-04 DIAGNOSIS — F3181 Bipolar II disorder: Secondary | ICD-10-CM | POA: Diagnosis not present

## 2022-12-04 LAB — COMPREHENSIVE METABOLIC PANEL
ALT: 19 U/L (ref 0–44)
AST: 25 U/L (ref 15–41)
Albumin: 3.9 g/dL (ref 3.5–5.0)
Alkaline Phosphatase: 57 U/L (ref 38–126)
Anion gap: 9 (ref 5–15)
BUN: 9 mg/dL (ref 6–20)
CO2: 28 mmol/L (ref 22–32)
Calcium: 8.7 mg/dL — ABNORMAL LOW (ref 8.9–10.3)
Chloride: 100 mmol/L (ref 98–111)
Creatinine, Ser: 0.88 mg/dL (ref 0.61–1.24)
GFR, Estimated: >60 mL/min (ref >60)
Glucose, Bld: 93 mg/dL (ref 70–99)
Potassium: 3.9 mmol/L (ref 3.5–5.1)
Sodium: 137 mmol/L (ref 135–145)
Total Bilirubin: 0.8 mg/dL (ref <1.2)
Total Protein: 7.5 g/dL (ref 6.5–8.1)

## 2022-12-04 LAB — HEMOGLOBIN A1C
Hgb A1c MFr Bld: 4.8 % (ref 4.8–5.6)
Mean Plasma Glucose: 91.06 mg/dL

## 2022-12-04 LAB — LIPID PANEL
Cholesterol: 143 mg/dL (ref 0–200)
HDL: 62 mg/dL (ref >40)
LDL Cholesterol: 58 mg/dL (ref 0–99)
Total CHOL/HDL Ratio: 2.3 {ratio}
Triglycerides: 116 mg/dL (ref <150)
VLDL: 23 mg/dL (ref 0–40)

## 2022-12-04 LAB — TSH: TSH: 3.974 u[IU]/mL (ref 0.350–4.500)

## 2022-12-04 NOTE — Plan of Care (Signed)
  Problem: Education: Goal: Knowledge of [facility] Education information/materials will improve Outcome: Progressing Goal: Emotional status will improve Outcome: Progressing Goal: Mental status will improve Outcome: Progressing Goal: Verbalization of understanding the information provided will improve Outcome: Progressing   

## 2022-12-04 NOTE — Plan of Care (Signed)

## 2022-12-04 NOTE — Progress Notes (Signed)
Patient appears depressed. Patient denies SI/HI/AVH. Pt reports anxiety is 1/10 and depression is 1/10. Pt reports good sleep and good appetite. Patient complied with morning medication with no reported side effects. Patient remains safe on Q26min checks and contracts for safety.      12/04/22 0953  Psych Admission Type (Psych Patients Only)  Admission Status Involuntary  Psychosocial Assessment  Patient Complaints None  Eye Contact Brief  Facial Expression Anxious;Flat  Affect Anxious;Depressed  Speech Soft;Logical/coherent  Interaction Guarded  Motor Activity Fidgety  Appearance/Hygiene Unremarkable;In scrubs  Behavior Characteristics Cooperative;Anxious  Mood Anxious;Depressed  Thought Process  Coherency WDL  Content WDL  Delusions None reported or observed  Perception WDL  Hallucination None reported or observed  Judgment Impaired  Confusion None  Danger to Self  Current suicidal ideation? Denies  Agreement Not to Harm Self Yes  Description of Agreement verbal  Danger to Others  Danger to Others None reported or observed

## 2022-12-04 NOTE — Group Note (Signed)
LCSW Group Therapy Note  Group Date: 12/04/2022 Start Time: 1100 End Time: 1200   Type of Therapy and Topic:  Group Therapy - Healthy vs Unhealthy Coping Skills  Participation Level:  Active   Description of Group The focus of this group was to determine what unhealthy coping techniques typically are used by group members and what healthy coping techniques would be helpful in coping with various problems. Patients were guided in becoming aware of the differences between healthy and unhealthy coping techniques. Patients were asked to identify 2-3 healthy coping skills they would like to learn to use more effectively.  Therapeutic Goals Patients learned that coping is what human beings do all day long to deal with various situations in their lives Patients defined and discussed healthy vs unhealthy coping techniques Patients identified their preferred coping techniques and identified whether these were healthy or unhealthy Patients determined 2-3 healthy coping skills they would like to become more familiar with and use more often. Patients provided support and ideas to each other   Summary of Patient Progress:  During group, patient proved open to input from peers and feedback from CSW. Patient demonstrated positive insight into the subject matter, was respectful of peers, and participated throughout the entire session.   Therapeutic Modalities Cognitive Behavioral Therapy Motivational Interviewing  Kathi Der, LCSWA 12/04/2022  1:26 PM

## 2022-12-04 NOTE — Group Note (Signed)
Date:  12/04/2022 Time:  9:35 PM  Group Topic/Focus:  Wrap-Up Group:   The focus of this group is to help patients review their daily goal of treatment and discuss progress on daily workbooks.    Participation Level:  Active  Participation Quality:  Appropriate and Sharing  Affect:  Appropriate  Cognitive:  Appropriate  Insight: Appropriate  Engagement in Group:  Engaged  Modes of Intervention:  Activity and Socialization  Additional Comments:  The patient rated his day a "7 or 8 out of 10". The patient stated that he had a "good day". The patient stated that he is hoping for discharge soon and that stated that his plan is to get outpatient care/therapy once discharged. The patient also shared that he talked with friends which he stated was good. The patient participated in the ice breaker/riddle activity after sharing.   Trevor Brewer 12/04/2022, 9:35 PM

## 2022-12-04 NOTE — Progress Notes (Signed)
[facility] MD Progress Note  12/04/2022 11:21 AM Trevor Brewer  MRN:  [id number]   Reason for Admission:  Trevor Brewer is a 32 y.o. male with a past psychiatric history significant for bipolar disorder and anxiety who presents to the [facility] Involuntary from [facility] Emergency Department on 12/03/2022 for evaluation and management of suicide attempt with alcohol intoxication.  The patient is currently on Hospital Day 2.   Chart Review from last 24 hours:  The patient's chart was reviewed and nursing notes were reviewed. Vital signs: stable. The patient's case was discussed in multidisciplinary team meeting. Per [facility], patient was taking medications appropriately. Patient received the following PRN medications: trazodone. Per nursing, patient is calm and cooperative and attended groups.   Information Obtained Today During Patient Interview: The patient was seen in an interview room on the unit, no acute distress. On assessment, the patient feels "great" today. Patient feels the group sessions have been helpful, he reports that he attended an AA meeting on the unit yesterday and found it very impactful. He states "I've always wanted to go to [employer] and I'm glad I got to go." He stated that it made him feel better to know that he is not alone with his struggles and learned how to be patient with himself. He expressed interest in continuing with AA or SAIOP upon discharge.  When asked about speaking with family or friends, he reports that he was visited by his male friend and the visit was good. He reports that he was surprised this individual seems to care more about him than he previously thought. Patient reports having good sleep and good appetite. Patient feels that the medications have been helpful and denies adverse effects. He reports that his tremors and shaking had improved, and he was able to sleep through the night without being awoken by his shaking. He also reports that his  friend over the phone noted that he seems "more calm" and that his stuttering had improved as well. He is discharge focused.   Denies SI, HI, AVH.    Principal Problem: Bipolar disorder (HCC) Diagnosis: Principal Problem:   Bipolar disorder (HCC) Active Problems:   GAD (generalized anxiety disorder)   Alcohol use disorder, severe, dependence (HCC)   Bipolar 1 disorder, depressed, severe (HCC)    Past Psychiatric History:  Previous Psych Diagnoses: bipolar disorder, anxiety, depression Prior psychiatric treatment: buspar, atarax (stopped due to concern of polypharmacy) Psychiatric medication compliance history: reports good compliance   Current psychiatric treatment: cymbalta 120 mg, vraylar 3 mg, lamictal 50 mg, trazodone 50 mg Current psychiatrist: Psych NP at [facility] in [facility], last appt was 11/23/2022 Current therapist: "TJ" with [facility] (virtual)   Previous hospitalizations none History of suicide attempts: denies History of self harm: denies   Substance Use History: Alcohol: drinks alcohol from Thursday-Saturday of each week, averaging around 0.5 bottles of tequila per day, equalling 1.5 bottles total over 3 days, reports daily drinking in college with increased amount Hx withdrawal tremors/shakes: in college Hx alcoholic seizures: denies DUI: denies   --------   Tobacco: denies Marijuana: denies Cocaine: denies Methamphetamines: denies Ecstasy: denies Opiates: denies Benzodiazepines: denies Prescribed Meds abuse: denies   History of Detox / Rehab: denies  Past Medical History:  Past Medical History:  Diagnosis Date   GERD (gastroesophageal reflux disease)     Past Surgical History:  Procedure Laterality Date   arm surgery     fracture   Family History:  Family History  Problem Relation Age of Onset   Diabetes Paternal Uncle    Hypertension Paternal Uncle    Alcohol abuse Neg Hx    Cancer Neg Hx    Early death Neg Hx    Heart disease  Neg Hx    Hyperlipidemia Neg Hx    Kidney disease Neg Hx    Stroke Neg Hx    Family Psychiatric  History:  Psych: alcohol use in father, depression and anxiety in mother and father (per patient, undiagnosed) Suicide: denies Substance use family hx: alcohol use in father  Social History:  Abuse: denies Marital Status: single Children: none 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 [employer] for the past year Housing: lives alone, apartment Legal: denies Hotel manager: denies Weapons: denies   Current Medications: Current Facility-Administered Medications  Medication Dose Route Frequency Provider Last Rate Last Admin   acetaminophen (TYLENOL) tablet 650 mg  650 mg Oral Q6H PRN Motley-Mangrum, Jadeka A, PMHNP       alum & mag hydroxide-simeth (MAALOX/MYLANTA) 200-200-20 MG/5ML suspension 30 mL  30 mL Oral Q4H PRN Motley-Mangrum, Jadeka A, PMHNP       [START ON 12/05/2022] ARIPiprazole (ABILIFY) tablet 10 mg  10 mg Oral Daily Attiah, Nadir, MD       busPIRone (BUSPAR) tablet 10 mg  10 mg Oral BID Abbott Pao, Nadir, MD   10 mg at 12/04/22 0831   diphenhydrAMINE (BENADRYL) capsule 50 mg  50 mg Oral TID PRN Motley-Mangrum, Jadeka A, PMHNP       Or   diphenhydrAMINE (BENADRYL) injection 50 mg  50 mg Intramuscular TID PRN Motley-Mangrum, Geralynn Ochs A, PMHNP       [START ON 12/05/2022] DULoxetine (CYMBALTA) DR capsule 30 mg  30 mg Oral Daily Attiah, Nadir, MD       haloperidol (HALDOL) tablet 5 mg  5 mg Oral TID PRN Motley-Mangrum, Jadeka A, PMHNP       Or   haloperidol lactate (HALDOL) injection 5 mg  5 mg Intramuscular TID PRN Motley-Mangrum, Jadeka A, PMHNP       hydrOXYzine (ATARAX) tablet 25 mg  25 mg Oral TID PRN Motley-Mangrum, Jadeka A, PMHNP       lamoTRIgine (LAMICTAL) tablet 50 mg  50 mg Oral BID Abbott Pao, Nadir, MD   50 mg at 12/04/22 0831   loperamide (IMODIUM) capsule 2-4 mg  2-4 mg Oral PRN Motley-Mangrum, Jadeka  A, PMHNP       LORazepam (ATIVAN) tablet 2 mg  2 mg Oral TID PRN Motley-Mangrum, Jadeka A, PMHNP       Or   LORazepam (ATIVAN) injection 2 mg  2 mg Intramuscular TID PRN Motley-Mangrum, Jadeka A, PMHNP       LORazepam (ATIVAN) tablet 1 mg  1 mg Oral BID Abbott Pao, Nadir, MD   1 mg at 12/04/22 0831   Followed by   Melene Muller ON 12/05/2022] LORazepam (ATIVAN) tablet 0.5 mg  0.5 mg Oral BID Abbott Pao, Nadir, MD       LORazepam (ATIVAN) tablet 1 mg  1 mg Oral Q6H PRN Motley-Mangrum, Jadeka A, PMHNP   1 mg at 12/02/22 2303   magnesium hydroxide (MILK OF MAGNESIA) suspension 30 mL  30 mL Oral Daily PRN Motley-Mangrum, Jadeka A, PMHNP       multivitamin with minerals tablet 1 tablet  1 tablet Oral Daily Motley-Mangrum, Jadeka A, PMHNP   1 tablet at 12/04/22 0831   nicotine (  NICODERM CQ - dosed in mg/24 hours) patch 14 mg  14 mg Transdermal Daily Bobbitt, Shalon E, NP   14 mg at 12/04/22 0831   ondansetron (ZOFRAN-ODT) disintegrating tablet 4 mg  4 mg Oral Q6H PRN Motley-Mangrum, Jadeka A, PMHNP       thiamine (Vitamin B-1) tablet 100 mg  100 mg Oral Daily Motley-Mangrum, Jadeka A, PMHNP   100 mg at 12/04/22 0831   traZODone (DESYREL) tablet 50 mg  50 mg Oral QHS PRN Motley-Mangrum, Jadeka A, PMHNP   50 mg at 12/03/22 2119    Lab Results:  Results for orders placed or performed during the hospital encounter of 12/02/22 (from the past 48 hour(s))  TSH     Status: None   Collection Time: 12/04/22  6:31 AM  Result Value Ref Range   TSH 3.974 0.350 - 4.500 uIU/mL    Comment: Performed by a 3rd Generation assay with a functional sensitivity of <=0.01 uIU/mL. Performed at [facility], [address] [place], [place] [address]   Hemoglobin A1c     Status: None   Collection Time: 12/04/22  6:31 AM  Result Value Ref Range   Hgb A1c MFr Bld 4.8 4.8 - 5.6 %    Comment: (NOTE) Pre diabetes:          5.7%-6.4%  Diabetes:              >6.4%  Glycemic control for   <7.0% adults with diabetes     Mean Plasma Glucose 91.06 mg/dL    Comment: Performed at [facility], [address] [place], [place] [address]  Lipid panel     Status: None   Collection Time: 12/04/22  6:31 AM  Result Value Ref Range   Cholesterol 143 0 - 200 mg/dL   Triglycerides 626 <948 mg/dL   HDL 62 >54 mg/dL   Total CHOL/HDL Ratio 2.3 RATIO   VLDL 23 0 - 40 mg/dL   LDL Cholesterol 58 0 - 99 mg/dL    Comment:        Total Cholesterol/HDL:CHD Risk Coronary Heart Disease Risk Table                     Men   Women  1/2 Average Risk   3.4   3.3  Average Risk       5.0   4.4  2 X Average Risk   9.6   7.1  3 X Average Risk  23.4   11.0        Use the calculated Patient Ratio above and the CHD Risk Table to determine the patient's CHD Risk.        ATP III CLASSIFICATION (LDL):  <100     mg/dL   Optimal  627-035  mg/dL   Near or Above                    Optimal  130-159  mg/dL   Borderline  009-381  mg/dL   High  >829     mg/dL   Very High Performed at [facility], [address], [place], [place] [address]   Comprehensive metabolic panel     Status: Abnormal   Collection Time: 12/04/22  6:31 AM  Result Value Ref Range   Sodium 137 135 - 145 mmol/L   Potassium 3.9 3.5 - 5.1 mmol/L   Chloride 100 98 - 111 mmol/L   CO2 28 22 -  32 mmol/L   Glucose, Bld 93 70 - 99 mg/dL    Comment: Glucose reference range applies only to samples taken after fasting for at least 8 hours.   BUN 9 6 - 20 mg/dL   Creatinine, Ser 1.61 0.61 - 1.24 mg/dL   Calcium 8.7 (L) 8.9 - 10.3 mg/dL   Total Protein 7.5 6.5 - 8.1 g/dL   Albumin 3.9 3.5 - 5.0 g/dL   AST 25 15 - 41 U/L   ALT 19 0 - 44 U/L   Alkaline Phosphatase 57 38 - 126 U/L   Total Bilirubin 0.8 <1.2 mg/dL   GFR, Estimated >09 >60 mL/min    Comment: (NOTE) Calculated using the CKD-EPI Creatinine Equation (2021)    Anion gap 9 5 - 15    Comment: Performed at [facility], [address] [place], [place] [address]     Blood Alcohol level:  Lab Results  Component Value Date   ETH 289 (H) 12/02/2022    Metabolic Disorder Labs: Lab Results  Component Value Date   HGBA1C 4.8 12/04/2022   MPG 91.06 12/04/2022   No results found for: "PROLACTIN" Lab Results  Component Value Date   CHOL 143 12/04/2022   TRIG 116 12/04/2022   HDL 62 12/04/2022   CHOLHDL 2.3 12/04/2022   VLDL 23 12/04/2022   LDLCALC 58 12/04/2022   LDLCALC 76 02/22/2012    Physical Findings: AIMS:  , ,  ,  ,    CIWA:  CIWA-Ar Total: 0 COWS:     Musculoskeletal: Strength & Muscle Tone: within normal limits Gait & Station: normal Patient leans: N/A  Psychiatric Specialty Exam:  General Appearance: appears at stated age, casually dressed and groomed   Behavior: pleasant and cooperative  Psychomotor Activity: slight tremors noted, improved from yesterday  Eye Contact: fair Speech: normal amount, tone, volume and fluency   Mood: euthymic Affect: congruent, pleasant and interactive  Thought Process: linear, goal directed, no circumstantial or tangential thought process noted, no racing thoughts or flight of ideas Descriptions of Associations: intact Thought Content: no bizarre content, logical and future-oriented Hallucinations: denies AH, VH , does not appear responding to stimuli Delusions: no paranoia, delusions of control, grandeur, ideas of reference, thought broadcasting, and magical thinking Suicidal Thoughts: denies SI, intention, plan  Homicidal Thoughts: denies HI, intention, plan   Alertness/Orientation: alert and fully oriented  Insight: fair Judgment: limited  Memory: intact  Executive Functions  Concentration: intact  Attention Span: fair Recall: intact Fund of Knowledge: fair    Agricultural engineer; Desire for Improvement; Housing; Health and safety inspector; Social Support; Transportation    Physical Exam: Constitutional:      Appearance: Normal appearance.   Pulmonary:     Effort: Pulmonary effort is normal.  Neurological:     General: No focal deficit present.     Mental Status: Alert and oriented to person, place, and time.    Review of Systems  No reported symptoms    ASSESSMENT:  Diagnoses / Active Problems: Principal Problem: Bipolar disorder (HCC) Diagnosis: Principal Problem:   Bipolar disorder (HCC) Active Problems:   GAD (generalized anxiety disorder)   Alcohol use disorder, severe, dependence (HCC)   Bipolar 1 disorder, depressed, severe (HCC)   Trevor Brewer is a 32 y.o., male with a past psychiatric history significant for bipolar disorder and anxiety who presents to the [facility] Involuntary from [facility] Emergency Department for evaluation and management of suicide attempt with alcohol intoxication.  PLAN: Safety and Monitoring:             -- Involuntary admission to inpatient psychiatric unit for safety, stabilization and treatment             -- Daily contact with patient to assess and evaluate symptoms and progress in treatment             -- Patient's case to be discussed in multi-disciplinary team meeting             -- Observation Level : q15 minute checks             -- Vital signs:  q12 hours             -- Precautions: suicide, elopement, and assault   2. Medications:               Psychiatric Diagnosis and Treatment   # Bipolar Disorder - Current phase: depressive - Continue Abilify 5 mg daily for depression and anxiety, with plans to increase to 10 mg on 11/6 - Continue Lamictal 50 mg BID for mood stabilization and manic episodes - Continue Cymbalta 60 mg daily to continue home medication, with plans to decrease to 30 mg on 11/6, and discontinue on 11/8 for concern of manic episodes and not effective for treating depression - Continue trazodone 50 mg prn nightly for sleep and low suspicion for suicidal risk - Discontinue Vraylar 3 mg due to recent OD attempt   # Alcohol  Dependency - chronic, severe - Continue CIWA protocol, for CIWA < 10 ativan Q6H - Continue Ativan taper: 1 mg TID --> 0.5 mg BID --> every day - Continue Thiamine (B1) tablet 100 mg daily  - Plan for SAIOP upon discharge   # Generalized anxiety disorder - Continue Buspar 10 mg BID for anxiety   Atarax 25 mg TID as needed for anxiety Agitation Protocol:    Medical Diagnosis and Treatment   # Pre-diabetic - Repeat CMP shows improved potassium (3.4 --> 3.9) and improved calcium (8.4 --> 8.7) all else WNL - Lipid panel and TSH WNL, A1c 4.8 - Offered that patient could get his wegovy from home to continue course, patient declined due to switching to zepbound and choosing to wait until discharge to continue   Patient in need of nicotine replacement; nicotine patch 14 mg / 24 hours ordered. Smoking cessation encouraged   Other as needed medications  Tylenol 650 mg every 6 hours as needed for pain Mylanta 30 mL every 4 hours as needed for indigestion Milk of magnesia 30 mL daily as needed for constipation       The risks/benefits/side-effects/alternatives to the above medication were discussed in detail with the patient and time was given for questions. The patient consents to medication trial. FDA black box warnings, if present, were discussed.   The patient is agreeable with the medication plan, as above. We will monitor the patient's response to pharmacologic treatment, and adjust medications as necessary.   3. Routine and other pertinent labs: EKG monitoring: 12/02/2022 QTc: 421   Metabolism / endocrine: BMI: Body mass index is 27.24 kg/m.   CBC: WNL CMP: 3.4 K, 21 CO2, 103 glucose, 8.4 calcium, all else WNL UDS: negative Ethanol: 289 TSH: WNL A1c: 4.8     4. Group Therapy:             -- Encouraged patient to participate in unit milieu and in scheduled group therapies              --  Short Term Goals: Ability to identify changes in lifestyle to reduce recurrence of  condition will improve, Ability to verbalize feelings will improve, Ability to disclose and discuss suicidal ideas, Ability to demonstrate self-control will improve, Ability to identify and develop effective coping behaviors will improve, Ability to maintain clinical measurements within normal limits will improve, Compliance with prescribed medications will improve, and Ability to identify triggers associated with substance abuse/mental health issues will improve             -- Long Term Goals: Improvement in symptoms so as ready for discharge -- Patient is encouraged to participate in group therapy while admitted to the psychiatric unit. -- We will address other chronic and acute stressors, which contributed to the patient's Bipolar disorder (HCC) in order to reduce the risk of self-harm at discharge.   5. Discharge Planning:              -- Social work and case management to assist with discharge planning and identification of hospital follow-up needs prior to discharge             -- Estimated LOS: 5-7 days             -- Discharge Concerns: Need to establish a safety plan; Medication compliance and effectiveness             -- Discharge Goals: Return home with outpatient referrals for mental health follow-up including medication management/psychotherapy     Total Time Spent in Direct Patient Care:  I personally spent 20 minutes on the unit in direct patient care. The direct patient care time included face-to-face time with the patient, reviewing the patient's chart, communicating with other professionals, and coordinating care. Greater than 50% of this time was spent in counseling or coordinating care with the patient regarding goals of hospitalization, psycho-education, and discharge planning needs.   Carolan Shiver, Medical Student 12/04/2022, 11:21 AM

## 2022-12-04 NOTE — Group Note (Signed)
Recreation Therapy Group Note   Group Topic:Animal Assisted Therapy   Group Date: 12/04/2022 Start Time: 4098 End Time: 1030 Facilitators: Pegge Cumberledge-McCall, LRT,CTRS Location: [address] Dayroom   Animal-Assisted Activity (AAA) Program Checklist/Progress Notes Patient Eligibility Criteria Checklist & Daily Group note for Rec Tx Intervention  AAA/T Program Assumption of Risk Form signed by Patient/ or Parent Legal Guardian Yes  Patient is free of allergies or severe asthma Yes  Patient reports no fear of animals Yes  Patient reports no history of cruelty to animals Yes  Patient understands his/her participation is voluntary Yes  Patient washes hands before animal contact Yes  Patient washes hands after animal contact Yes   Affect/Mood: Appropriate   Participation Level: Engaged   Participation Quality: Independent   Behavior: Appropriate   Speech/Thought Process: Focused   Insight: Good   Judgement: Good   Modes of Intervention: Teaching laboratory technician   Patient Response to Interventions:  Engaged   Education Outcome:  In group clarification offered    Clinical Observations/Individualized Feedback: Patient attended session and interacted appropriately with therapy dog and peers. Patient asked appropriate questions about therapy dog and his training. Patient shared stories about their pets at home with group.      Plan: Continue to engage patient in RT group sessions 2-3x/week.   Lendora Keys-McCall, LRT,CTRS  12/04/2022 12:10 PM

## 2022-12-05 DIAGNOSIS — F3181 Bipolar II disorder: Secondary | ICD-10-CM

## 2022-12-05 MED ORDER — LAMOTRIGINE 25 MG PO TABS: 50.0000 mg | ORAL_TABLET | Freq: Two times a day (BID) | ORAL | 0 refills | Status: DC

## 2022-12-05 MED ORDER — ADULT MULTIVITAMIN W/MINERALS CH: 1.0000 | ORAL_TABLET | Freq: Every day | ORAL | 0 refills | Status: DC

## 2022-12-05 MED ORDER — HYDROXYZINE HCL 25 MG PO TABS: 25.0000 mg | ORAL_TABLET | Freq: Three times a day (TID) | ORAL | 0 refills | Status: DC | PRN

## 2022-12-05 MED ORDER — DULOXETINE HCL 30 MG PO CPEP: 30.0000 mg | ORAL_CAPSULE | Freq: Every day | ORAL | 0 refills | Status: DC

## 2022-12-05 MED ORDER — BUSPIRONE HCL 10 MG PO TABS: 10.0000 mg | ORAL_TABLET | Freq: Two times a day (BID) | ORAL | 0 refills | Status: DC

## 2022-12-05 MED ORDER — TRAZODONE HCL 50 MG PO TABS: 50.0000 mg | ORAL_TABLET | Freq: Every evening | ORAL | 0 refills | Status: DC | PRN

## 2022-12-05 MED ORDER — ARIPIPRAZOLE 10 MG PO TABS: 10.0000 mg | ORAL_TABLET | Freq: Every day | ORAL | 0 refills | Status: DC

## 2022-12-05 NOTE — [facility] Suicide Risk Assessment (Signed)
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) Active Problems:   GAD (generalized anxiety disorder)   Alcohol use disorder, severe, dependence (HCC)   Bipolar 1 disorder, depressed, severe (HCC)   Total Time spent with patient: 15 minutes  Musculoskeletal: Strength & Muscle Tone: within normal limits Gait & Station: normal Patient leans: N/A  Psychiatric Specialty Exam  Presentation  General Appearance:  Appropriate for Environment  Eye Contact: Good  Speech: Clear and Coherent  Speech Volume: Normal  Handedness: Right   Mood and Affect  Mood: Anxious; Depressed  Duration of Depression Symptoms: No data recorded Affect: Appropriate   Thought Process  Thought Processes: Coherent  Descriptions of Associations:Intact  Orientation:Full (Time, Place and Person)  Thought Content:WDL  History of Schizophrenia/Schizoaffective disorder:No data recorded Duration of Psychotic Symptoms:No data recorded Hallucinations:No data recorded Ideas of Reference:None  Suicidal Thoughts:No data recorded Homicidal Thoughts:No data recorded  Sensorium  Memory: Immediate Good; Recent Good  Judgment: Poor  Insight: Fair   Art therapist  Concentration: Fair  Attention Span: Fair  Recall: Good  Fund of Knowledge: Good  Language: Good   Psychomotor Activity  Psychomotor Activity:No data recorded  Assets  Assets: Communication Skills; Desire for Improvement; Housing; Health and safety inspector; Social Support; Transportation   Sleep  Sleep:No data recorded  Physical Exam: Physical Exam Vitals and nursing note reviewed.  Constitutional:      Appearance: Normal appearance.  HENT:     Head: Normocephalic and atraumatic.  Eyes:     Extraocular Movements: Extraocular movements  intact.  Pulmonary:     Effort: Pulmonary effort is normal.  Musculoskeletal:        General: Normal range of motion.     Cervical back: Normal range of motion.  Neurological:     General: No focal deficit present.     Mental Status: He is alert and oriented to person, place, and time.  Psychiatric:        Mood and Affect: Mood normal.        Behavior: Behavior normal.    Review of Systems  Constitutional:  Negative for chills and fever.  HENT:  Negative for hearing loss.   Eyes:  Negative for blurred vision.  Respiratory:  Negative for cough and shortness of breath.   Cardiovascular:  Negative for chest pain.  Gastrointestinal:  Negative for constipation, diarrhea, nausea and vomiting.  Genitourinary:  Negative for dysuria.  Musculoskeletal:  Negative for myalgias.  Skin:  Negative for rash.  Neurological:  Negative for dizziness and headaches.  Psychiatric/Behavioral:  Negative for hallucinations and suicidal ideas.    Blood pressure 131/85, pulse (!) 106, temperature 98.3 F (36.8 C), temperature source Oral, resp. rate 18, height 5\' 10"  (1.778 m), weight 86.1 kg, SpO2 100%. Body mass index is 27.24 kg/m.  Mental Status Per Nursing Assessment::   On Admission:  Suicidal ideation indicated by others  Demographic Factors:  Male and [name], lesbian, or bisexual orientation  Loss Factors: Loss of significant relationship  Historical Factors: Family history of mental illness or substance abuse and Impulsivity  Risk Reduction Factors:   Employed, Positive social support, and Positive coping skills or problem solving skills  Continued Clinical Symptoms:  Bipolar Disorder:   Mixed State Alcohol/Substance Abuse/Dependencies  Cognitive Features That Contribute To Risk:  None    Suicide Risk:  Mild:  Suicidal ideation of limited frequency,  intensity, duration, and specificity.  There are no identifiable plans, no associated intent, mild dysphoria and related symptoms, good  self-control (both objective and subjective assessment), few other risk factors, and identifiable protective factors, including available and accessible social support.   Follow-up Information     Telford Nab, NP Follow up on 01/18/2023.   Specialty: Behavioral Health Why: You have an appointment for medication management services on 01/18/23 at 9:30 am. Contact information: [address] [place] [address] [phone]                 Plan Of Care/Follow-up recommendations:  Activity:  ad lib Diet:  low sodium, low fat Prescriptions for new medications provided for the patient to bridge to follow up appointment. The patient was informed that refills for these prescriptions are generally not provided, and patient is encouraged to attend all follow up appointments to address medication refills and adjustments.   Today's discharge was reviewed with treatment team, and the team is in agreement that the patient is ready for discharge. The patient is was of the discharge plan for today and has been given opportunity to ask questions. At time of discharge, the patient does not vocalize any acute harm to self or others, is goal directed, able to advocate for self and organizational baseline.   At discharge, the patient is instructed to:  Take all medications as prescribed. Report any adverse effects and or reactions from the medicines to her outpatient provider promptly.  Do not engage in alcohol and/or illegal drug use while on prescription medicines.  In the event of worsening symptoms, patient is instructed to call the crisis hotline, 911 and or go to the nearest ED for appropriate evaluation and treatment of symptoms.  Follow-up with primary care provider for further care of medical issues, concerns and or health care needs. * Substance abuse follow up: it is recommended that you follow up with community support treatment, like AA/NA. It is also recommended that the  patient attend 90 meetings in 90 days, otherwise known as "90 in [address]   Roselle Locus, MD 12/05/2022, 1:35 PM

## 2022-12-05 NOTE — Progress Notes (Signed)
   12/04/22 2115  Psych Admission Type (Psych Patients Only)  Admission Status Involuntary  Psychosocial Assessment  Patient Complaints None  Eye Contact Brief  Facial Expression Anxious;Flat  Affect Anxious;Depressed  Speech Logical/coherent;Soft  Interaction Guarded  Motor Activity Fidgety  Appearance/Hygiene Unremarkable  Behavior Characteristics Cooperative  Mood Depressed;Anxious  Thought Process  Coherency WDL  Content WDL  Delusions None reported or observed  Perception WDL  Hallucination None reported or observed  Judgment Impaired  Confusion None  Danger to Self  Current suicidal ideation? Denies  Agreement Not to Harm Self Yes  Description of Agreement verbal  Danger to Others  Danger to Others None reported or observed

## 2022-12-05 NOTE — [facility] Suicide Risk Assessment (Signed)
[facility] INPATIENT:  Family/Significant Other Suicide Prevention Education  Suicide Prevention Education:  Education Completed; [employer],  (friend) has been identified by the patient as the family member/significant other with whom the patient will be residing, and identified as the person(s) who will aid the patient in the event of a mental health crisis (suicidal ideations/suicide attempt).  With written consent from the patient, the family member/significant other has been provided the following suicide prevention education, prior to the and/or following the discharge of the patient.  The suicide prevention education provided includes the following: Suicide risk factors Suicide prevention and interventions National Suicide Hotline telephone number [facility] assessment telephone number [facility] Emergency Assistance 911 [facility] and/or Residential Mobile Crisis Unit telephone number  Request made of family/significant other to: Remove weapons (e.g., guns, rifles, knives), all items previously/currently identified as safety concern.   Remove drugs/medications (over-the-counter, prescriptions, illicit drugs), all items previously/currently identified as a safety concern.  The family member/significant other verbalizes understanding of the suicide prevention education information provided.  The family member/significant other agrees to remove the items of safety concern listed above.  Trevor Brewer Trevor Brewer 12/05/2022, 1:56 PM

## 2022-12-05 NOTE — Group Note (Signed)
Recreation Therapy Group Note   Group Topic:Other  Group Date: 12/05/2022 Start Time: 1403 End Time: 1450 Facilitators: Rowen Hur-McCall, LRT,CTRS Location: [address] Dayroom   Activity Description/Intervention: Therapeutic Drumming. Patients with peers and staff were given the opportunity to engage in a leader facilitated [facility] Empowerment Drumming Circle with staff from the [employer], in partnership with [employer]. Teaching laboratory technician and trained [employer], Theodoro Doing leading with LRT observing and documenting intervention and pt response. This evidenced-based practice targets 7 areas of health and wellbeing in the human experience including: stress-reduction, exercise, self-expression, camaraderie/support, nurturing, spirituality, and music-making (leisure).   Goal Area(s) Addresses:  Patient will engage in pro-social way in music group.  Patient will follow directions of drum leader on the first prompt. Patient will demonstrate no behavioral issues during group.  Patient will identify if a reduction in stress level occurs as a result of participation in therapeutic drum circle.    Education: Leisure exposure, Pharmacologist, Musical expression, Discharge Planning    Affect/Mood: N/A   Participation Level: Did not attend    Clinical Observations/Individualized Feedback:     Plan: Continue to engage patient in RT group sessions 2-3x/week.   Chuck Caban-McCall, LRT,CTRS 12/05/2022 3:00 PM

## 2022-12-05 NOTE — Discharge Summary (Signed)
Physician Discharge Summary Note Patient:  Trevor Brewer is an 32 y.o., male MRN:  [id number] DOB:  [birth year] Patient phone:  [phone] (home)  Patient address:   [address] [place] [address],  Total Time spent with patient: 20 minutes  Date of Admission:  12/02/2022 Date of Discharge: 12/05/2022  Reason for Admission:    Trevor Longstreth is a 32 y.o., male with a past psychiatric history significant for bipolar disorder and anxiety who presents to the [facility] Involuntary from [facility] Emergency Department for evaluation and management of suicide attempt with alcohol intoxication.     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) Active Problems:   GAD (generalized anxiety disorder)   Alcohol use disorder, severe, dependence (HCC)   Bipolar 1 disorder, depressed, severe (HCC)   Past Psychiatric History: Previous Psych Diagnoses: bipolar disorder, anxiety, depression Prior psychiatric treatment: buspar, atarax (stopped due to concern of polypharmacy) Psychiatric medication compliance history: reports good compliance   Current psychiatric treatment: cymbalta 120 mg, vraylar 3 mg, lamictal 50 mg, trazodone 50 mg Current psychiatrist: Psych NP at [facility] in [facility], last appt was 11/23/2022 Current therapist: "TJ" with [facility] (virtual)   Previous hospitalizations none History of suicide attempts: denies History of self harm: denies   Substance Use History: Alcohol: drinks alcohol from Thursday-Saturday of each week, averaging around 0.5 bottles of tequila per day, equalling 1.5 bottles total over 3 days, reports daily drinking in college with increased amount Hx withdrawal tremors/shakes: in college Hx alcoholic seizures: denies DUI: denies   --------   Tobacco: denies Marijuana: denies Cocaine:  denies Methamphetamines: denies Ecstasy: denies Opiates: denies Benzodiazepines: denies Prescribed Meds abuse: denies   History of Detox / Rehab: denies  Past Medical History:  Past Medical History:  Diagnosis Date   GERD (gastroesophageal reflux disease)     Past Surgical History:  Procedure Laterality Date   arm surgery     fracture    Family History:  Medical: diabetes in paternal uncle, HTN in paternal uncle Psych: alcohol use in father, depression and anxiety in mother and father (per patient, undiagnosed) Suicide: denies Substance use family hx: alcohol use in father  Social History:  Abuse: denies Marital Status: single Children: none 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) Active Problems:   GAD (generalized anxiety disorder)   Alcohol use disorder, severe, dependence (HCC)   Bipolar 1 disorder, depressed, severe (HCC)   The following medications were managed:  Upon admission, the following medications were changed / started / discontinued: # Bipolar Disorder - Current phase: depressive - Start Abilify 5 mg daily for depression and anxiety, with plans to increase to 10 mg on 11/6 - Start Lamictal 50 mg BID for mood stabilization and manic episodes - Start Cymbalta 60 mg daily to continue home medication, with plans to decrease to 30 mg on 11/6, and discontinue on 11/8 for concern of manic episodes and not effective for treating depression -  Start trazodone 50 mg prn nightly for  sleep and low suspicion for suicidal risk - Discontinue Vraylar 3 mg due to recent OD attempt   # Alcohol Dependency - chronic, severe - Start CIWA protocol, for CIWA < 10 ativan Q6H - Start Ativan taper: 1 mg TID --> 0.5 mg BID --> every day - Start Thiamine (B1) tablet 100 mg daily  - Plan for SAIOP upon discharge   # Generalized anxiety disorder - Start Buspar 10 mg BID for anxiety Atarax 25 mg TID as needed for anxiety   # Pre-diabetic - Order repeat CMP for 11/5 AM for borderline potassium (3.4) - Order TSH, lipid panel, A1c - Offered that patient could get his wegovy from home to continue course, patient declined due to switching to zepbound and choosing to wait until discharge to continue   Patient in need of nicotine replacement; nicotine patch 14 mg / 24 hours ordered. Smoking cessation encouraged   Other as needed medications  Tylenol 650 mg every 6 hours as needed for pain Mylanta 30 mL every 4 hours as needed for indigestion Milk of magnesia 30 mL daily as needed for constipation  During the patient's stay, the following medications were changed / started / discontinued, with final adjustments by discharge:  --  Decrease Cymbalta from 60 mg daily to 30 mg daily with plans to discontinue on 11/8 for concern of manic episodes and not effective for treating depression   During the hospitalization, patient had the following lab / imaging / testing abnormalities which require further evaluation / management / treatment: Repeat CMP shows improved potassium (3.4 --> 3.9) and improved calcium (8.4 --> 8.7) all else WNL, Lipid panel and TSH WNL, A1c 4.8   Patient's care was discussed during the interdisciplinary team meeting every day during the hospitalization.  The patient denies any side effects to prescribed psychiatric medication.  Gradually, patient started adjusting to milieu. The patient was evaluated each day by a clinical provider to ascertain response to treatment.  Improvement was noted by the patient's report of decreasing symptoms, improved sleep and appetite, affect, medication tolerance, behavior, and participation in unit programming.  Patient was asked each day to complete a self inventory noting mood, mental status, pain, new symptoms, anxiety and concerns.    Symptoms were reported as significantly decreased or resolved completely by discharge.   On day of discharge, the patient reports that their mood is stable. The patient denied having suicidal thoughts for more than 48 hours prior to discharge.  Patient denies having homicidal thoughts.  Patient denies having auditory hallucinations.  Patient denies any visual hallucinations or other symptoms of psychosis. The patient was motivated to continue taking medication with a goal of continued improvement in mental health.   The patient reports their target psychiatric symptoms of bipolar disorder responded well to the psychiatric medications, and the patient reports overall benefit other psychiatric hospitalization. Supportive psychotherapy was provided to the patient. The patient also participated in regular group therapy while hospitalized. Coping skills, problem solving as well as relaxation therapies were also part of the unit programming.  Labs were reviewed with the patient, and abnormal results were discussed with the patient.  The patient is able to verbalize their individual safety plan to this provider.  Behavioral Events: none  Restraints: none  # It is recommended to the patient to continue psychiatric medications as prescribed, after discharge from the hospital.    # It is recommended to the patient to follow up with  your outpatient psychiatric provider and PCP.  # It was discussed with the patient, the impact of alcohol, drugs, tobacco have been there overall psychiatric and medical wellbeing, and total abstinence from substance use was recommended to the patient.  # Prescriptions  provided or sent directly to preferred pharmacy at discharge. Patient agreeable to plan. Given opportunity to ask questions. Appears to feel comfortable with discharge.    # In the event of worsening symptoms, the patient is instructed to call the crisis hotline, 911 and or go to the nearest ED for appropriate evaluation and treatment of symptoms. To follow-up with primary care provider for other medical issues, concerns and or health care needs  # Patient was discharged home with a plan to follow up as noted below.  Physical Findings: AIMS:  , ,  ,  ,    CIWA:  CIWA-Ar Total: 0 COWS:     Mental Status Exam: General Appearance: appears at stated age, casually dressed and groomed   Behavior: pleasant and cooperative   Psychomotor Activity: no psychomotor agitation or retardation noted   Eye Contact: fair  Speech: normal amount, tone, volume and fluency    Mood: euthymic  Affect: congruent, pleasant and interactive   Thought Process: linear, goal directed, no circumstantial or tangential thought process noted, no racing thoughts or flight of ideas  Descriptions of Associations: intact  Thought Content: no bizarre content, logical and future-oriented  Hallucinations: denies AH, VH , does not appear responding to stimuli  Delusions: no paranoia, delusions of control, grandeur, ideas of reference, thought broadcasting, and magical thinking  Suicidal Thoughts: denies SI, intention, plan  Homicidal Thoughts: denies HI, intention, plan   Alertness/Orientation: alert and fully oriented   Insight: fair, improved  Judgment: fair, improved   Memory: intact   Executive Functions  Concentration: intact  Attention Span: fair  Recall: intact  Fund of Knowledge: fair    Physical Exam  General: Pleasant, well-appearing . No acute distress. Pulmonary: Normal effort. No wheezing or rales. Skin: No obvious rash or lesions. Neuro: A&Ox3.No focal deficit.   Review of Systems  No  reported symptoms  Blood pressure 131/85, pulse (!) 106, temperature 98.3 F (36.8 C), temperature source Oral, resp. rate 18, height 5\' 10"  (1.778 m), weight 86.1 kg, SpO2 100%. Body mass index is 27.24 kg/m.  Assets  Assets:Communication Skills; Desire for Improvement; Housing; Health and safety inspector; Social Support; Transportation   Social History   Tobacco Use  Smoking Status Never  Smokeless Tobacco Never   Tobacco Cessation:  A prescription for an FDA-approved tobacco cessation medication provided at discharge   Blood Alcohol level:  Lab Results  Component Value Date   ETH 289 (H) 12/02/2022    Metabolic Disorder Labs:  Lab Results  Component Value Date   HGBA1C 4.8 12/04/2022   MPG 91.06 12/04/2022   No results found for: "PROLACTIN" Lab Results  Component Value Date   CHOL 143 12/04/2022   TRIG 116 12/04/2022   HDL 62 12/04/2022   CHOLHDL 2.3 12/04/2022   VLDL 23 12/04/2022   LDLCALC 58 12/04/2022   LDLCALC 76 02/22/2012     Is patient on multiple antipsychotic therapies at discharge:  No   Has Patient had three or more failed trials of antipsychotic monotherapy by history:  No  Recommended Plan for Multiple Antipsychotic Therapies: NA  Discharge Instructions     Diet - low sodium heart healthy   Complete by: As directed    Discharge instructions   Complete  by: As directed    Prescriptions for new medications provided for the patient to bridge to follow up appointment. The patient was informed that refills for these prescriptions are generally not provided, and patient is encouraged to attend all follow up appointments to address medication refills and adjustments.   Today's discharge was reviewed with treatment team, and the team is in agreement that the patient is ready for discharge. The patient is was of the discharge plan for today and has been given opportunity to ask questions. At time of discharge, the patient does not vocalize any acute  harm to self or others, is goal directed, able to advocate for self and organizational baseline.   At discharge, the patient is instructed to:  Take all medications as prescribed. Report any adverse effects and or reactions from the medicines to her outpatient provider promptly.  Do not engage in alcohol and/or illegal drug use while on prescription medicines.  In the event of worsening symptoms, patient is instructed to call the crisis hotline, 911 and or go to the nearest ED for appropriate evaluation and treatment of symptoms.  Follow-up with primary care provider for further care of medical issues, concerns and or health care needs. * Substance abuse follow up: it is recommended that you follow up with community support treatment, like AA/NA. It is also recommended that the patient attend 90 meetings in 90 days, otherwise known as "90 in 90"   Increase activity slowly   Complete by: As directed       Allergies as of 12/05/2022   No Known Allergies      Medication List     STOP taking these medications    famotidine 10 MG tablet Commonly known as: PEPCID   Vraylar 3 MG capsule Generic drug: cariprazine   Zepbound 5 MG/0.5ML Pen Generic drug: tirzepatide       TAKE these medications      Indication  ARIPiprazole 10 MG tablet Commonly known as: ABILIFY Take 1 tablet (10 mg total) by mouth daily. Start taking on: December 06, 2022  Indication: MIXED BIPOLAR AFFECTIVE DISORDER   busPIRone 10 MG tablet Commonly known as: BUSPAR Take 1 tablet (10 mg total) by mouth 2 (two) times daily.  Indication: Anxiety Disorder   DULoxetine 30 MG capsule Commonly known as: CYMBALTA Take 1 capsule (30 mg total) by mouth daily. Take one tablet by mouth daily until gone, and then stop Start taking on: December 06, 2022 What changed:  medication strength how much to take additional instructions  Indication: Generalized Anxiety Disorder   hydrOXYzine 25 MG tablet Commonly known  as: ATARAX Take 1 tablet (25 mg total) by mouth 3 (three) times daily as needed for anxiety.  Indication: Feeling Anxious   lamoTRIgine 25 MG tablet Commonly known as: LAMICTAL Take 2 tablets (50 mg total) by mouth 2 (two) times daily. What changed: when to take this  Indication: Manic-Depression   multivitamin with minerals Tabs tablet Take 1 tablet by mouth daily. Start taking on: December 06, 2022  Indication: dietary support   traZODone 50 MG tablet Commonly known as: DESYREL Take 1 tablet (50 mg total) by mouth at bedtime as needed for sleep.  Indication: Trouble Sleeping   Wegovy 2.4 MG/0.75ML Soaj Generic drug: Semaglutide-Weight Management Inject 2.4 mg into the skin once a week.  Indication: OBESITY        Follow-up Information     Telford Nab, NP Follow up on 01/18/2023.   Specialty: Behavioral Health  Why: You have an appointment for medication management services on 01/18/23 at 9:30 am. 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 The patient voiced understanding and agreed to seeking medical assistance if he experiences any withdrawal effects including but not limited to severe headache, trembling, seizure like activity, dizziness or severe fatigue, heart palpitations or chest pain.   - Patient voiced that he feels safe to return home, has no concerns at this time, agreed to follow up with all outpatient services recommended and his medications as prescribed, and denied all SI, HI, AVH  Activity: as tolerated  Diet: heart healthy  # It is recommended to the patient to continue psychiatric  medications as prescribed, after discharge from the hospital.     # It is recommended to the patient to follow up with your outpatient psychiatric provider -instructions on appointment date, time, and address (location) are provided to you in discharge paperwork, including [facility] IOP resources  # Follow-up with outpatient primary care doctor and other specialists -for management of chronic medical disease, including: Pre-diabetic status  # Testing: Follow-up with outpatient provider for abnormal lab results: Repeat CMP shows improved potassium (3.4 --> 3.9) and improved calcium (8.4 --> 8.7) all else WNL, Lipid panel and TSH WNL, A1c 4.8   # It was discussed with the patient, the impact of alcohol, drugs, tobacco have been there overall psychiatric and medical wellbeing, and total abstinence from substance use was recommended to the patient.   # Prescriptions provided or sent directly to preferred pharmacy at discharge. Patient agreeable to plan. Given opportunity to ask questions. Appears to feel comfortable with discharge.    # In the event of worsening symptoms, the patient is instructed to call the crisis hotline, 911, and or go to the nearest ED for appropriate evaluation and treatment of symptoms. To follow-up with primary care provider for other medical issues, concerns and or health care needs  Patient agrees with D/C instructions and plan.   I discussed my assessment, planned testing and intervention for the patient with Dr. Loleta Chance who agrees with my formulated course of action.   Signed: Carolan Shiver, Medical Student,  12/05/2022, 1:37 PM

## 2022-12-05 NOTE — Progress Notes (Signed)
[facility] MD Progress Note  12/05/2022 7:17 AM Trevor Koike  MRN:  [id number]   Reason for Admission:  Trevor Brewer is a 32 y.o. male with a past psychiatric history significant for bipolar disorder and anxiety who presents to the [facility] Involuntary from [facility] Emergency Department on 12/03/2022 for evaluation and management of suicide attempt with alcohol intoxication.  The patient is currently on Hospital Day 3.   Chart Review from last 24 hours:  The patient's chart was reviewed and nursing notes were reviewed. Vital signs: stable. The patient's case was discussed in multidisciplinary team meeting. Per [facility], patient was taking medications appropriately. Patient received the following PRN medications: trazodone. Per nursing, patient is calm and cooperative and attended groups.   Information Obtained Today During Patient Interview: The patient was seen in his room on the unit this AM, no acute distress. On assessment, the patient reports that he slept very well in a deep sleep for about 8-9 hours. He also reports that his tremors have completely stopped and he is able to sleep through the night undisturbed. He states that he is feeling much calmer and has noticed that his racing thoughts and rapid speech have significantly decreased. He reports that he has been eating well and attended all groups yesterday. He has found the groups very helpful and is motivated to attend SA IOP or follow up with AA meetings in the outpatient setting upon discharge. He states that he learned yesterday "medication helps with depression, but I need to also put the work in to help myself get better." He reports that he called many of his friends yesterday and these were good conversations. We discussed his trip to [place] on November 12th, and potential discharge on the 9th after his Cymbalta taper is completed. Patient was agreeable to this and seemed much brighter and more energetic than previous  interviews. He displayed good insight on how his drinking affects his mental health and why stopping drinking would be very beneficial to him.   Denies SI, HI, AVH.    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) Active Problems:   GAD (generalized anxiety disorder)   Alcohol use disorder, severe, dependence (HCC)   Bipolar 1 disorder, depressed, severe (HCC)    Past Psychiatric History:  Previous Psych Diagnoses: bipolar disorder, anxiety, depression Prior psychiatric treatment: buspar, atarax (stopped due to concern of polypharmacy) Psychiatric medication compliance history: reports good compliance   Current psychiatric treatment: cymbalta 120 mg, vraylar 3 mg, lamictal 50 mg, trazodone 50 mg Current psychiatrist: Psych NP [facility], last appt was 11/23/2022 Current therapist: "TJ" with [facility] ([facility])   Previous hospitalizations none History of suicide attempts: denies History of self harm: denies   Substance Use History: Alcohol: drinks alcohol from Thursday-Saturday of each week, averaging around 0.5 bottles of tequila per day, equalling 1.5 bottles total over 3 days, reports daily drinking in college with increased amount Hx withdrawal tremors/shakes: in college Hx alcoholic seizures: denies DUI: denies   --------   Tobacco: denies Marijuana: denies Cocaine: denies Methamphetamines: denies Ecstasy: denies Opiates: denies Benzodiazepines: denies Prescribed Meds abuse: denies   History of Detox / Rehab: denies  Past Medical History:  Past Medical History:  Diagnosis Date   GERD (gastroesophageal reflux disease)     Past Surgical History:  Procedure Laterality Date   arm surgery  fracture   Family History:  Family History  Problem Relation Age of Onset   Diabetes Paternal Uncle    Hypertension Paternal Uncle     Alcohol abuse Neg Hx    Cancer Neg Hx    Early death Neg Hx    Heart disease Neg Hx    Hyperlipidemia Neg Hx    Kidney disease Neg Hx    Stroke Neg Hx    Family Psychiatric  History:  Psych: alcohol use in father, depression and anxiety in mother and father (per patient, undiagnosed) Suicide: denies Substance use family hx: alcohol use in father  Social History:  Abuse: denies Marital Status: single Children: none 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 [employer] for the past year Housing: lives alone, apartment Legal: denies Hotel manager: denies Weapons: denies   Current Medications: Current Facility-Administered Medications  Medication Dose Route Frequency Provider Last Rate Last Admin   acetaminophen (TYLENOL) tablet 650 mg  650 mg Oral Q6H PRN Motley-Mangrum, Jadeka A, PMHNP       alum & mag hydroxide-simeth (MAALOX/MYLANTA) 200-200-20 MG/5ML suspension 30 mL  30 mL Oral Q4H PRN Motley-Mangrum, Jadeka A, PMHNP       ARIPiprazole (ABILIFY) tablet 10 mg  10 mg Oral Daily Attiah, Nadir, MD       busPIRone (BUSPAR) tablet 10 mg  10 mg Oral BID Abbott Pao, Nadir, MD   10 mg at 12/04/22 1726   diphenhydrAMINE (BENADRYL) capsule 50 mg  50 mg Oral TID PRN Motley-Mangrum, Jadeka A, PMHNP       Or   diphenhydrAMINE (BENADRYL) injection 50 mg  50 mg Intramuscular TID PRN Motley-Mangrum, Jadeka A, PMHNP       DULoxetine (CYMBALTA) DR capsule 30 mg  30 mg Oral Daily Attiah, Nadir, MD       haloperidol (HALDOL) tablet 5 mg  5 mg Oral TID PRN Motley-Mangrum, Jadeka A, PMHNP       Or   haloperidol lactate (HALDOL) injection 5 mg  5 mg Intramuscular TID PRN Motley-Mangrum, Jadeka A, PMHNP       hydrOXYzine (ATARAX) tablet 25 mg  25 mg Oral TID PRN Motley-Mangrum, Jadeka A, PMHNP       lamoTRIgine (LAMICTAL) tablet 50 mg  50 mg Oral BID Abbott Pao, Nadir, MD   50 mg at 12/04/22 2117   loperamide (IMODIUM) capsule 2-4 mg   2-4 mg Oral PRN Motley-Mangrum, Jadeka A, PMHNP       LORazepam (ATIVAN) tablet 2 mg  2 mg Oral TID PRN Motley-Mangrum, Jadeka A, PMHNP       Or   LORazepam (ATIVAN) injection 2 mg  2 mg Intramuscular TID PRN Motley-Mangrum, Jadeka A, PMHNP       LORazepam (ATIVAN) tablet 0.5 mg  0.5 mg Oral BID Abbott Pao, Nadir, MD       LORazepam (ATIVAN) tablet 1 mg  1 mg Oral Q6H PRN Motley-Mangrum, Jadeka A, PMHNP   1 mg at 12/02/22 2303   magnesium hydroxide (MILK OF MAGNESIA) suspension 30 mL  30 mL Oral Daily PRN Motley-Mangrum, Jadeka A, PMHNP       multivitamin with minerals tablet 1 tablet  1 tablet Oral Daily Motley-Mangrum, Jadeka A, PMHNP   1 tablet at 12/04/22 0831   nicotine (NICODERM CQ - dosed in mg/24 hours) patch 14 mg  14 mg Transdermal Daily Bobbitt, Shalon E, NP   14 mg at 12/04/22 0831   ondansetron (  ZOFRAN-ODT) disintegrating tablet 4 mg  4 mg Oral Q6H PRN Motley-Mangrum, Jadeka A, PMHNP       thiamine (Vitamin B-1) tablet 100 mg  100 mg Oral Daily Motley-Mangrum, Jadeka A, PMHNP   100 mg at 12/04/22 0831   traZODone (DESYREL) tablet 50 mg  50 mg Oral QHS PRN Motley-Mangrum, Jadeka A, PMHNP   50 mg at 12/04/22 2117    Lab Results:  Results for orders placed or performed during the hospital encounter of 12/02/22 (from the past 48 hour(s))  TSH     Status: None   Collection Time: 12/04/22  6:31 AM  Result Value Ref Range   TSH 3.974 0.350 - 4.500 uIU/mL    Comment: Performed by a 3rd Generation assay with a functional sensitivity of <=0.01 uIU/mL. Performed at [facility], [address] [place], [place] [address]   Hemoglobin A1c     Status: None   Collection Time: 12/04/22  6:31 AM  Result Value Ref Range   Hgb A1c MFr Bld 4.8 4.8 - 5.6 %    Comment: (NOTE) Pre diabetes:          5.7%-6.4%  Diabetes:              >6.4%  Glycemic control for   <7.0% adults with diabetes    Mean Plasma Glucose 91.06 mg/dL    Comment: Performed at [facility],  [address], [place], [place] [address]  Lipid panel     Status: None   Collection Time: 12/04/22  6:31 AM  Result Value Ref Range   Cholesterol 143 0 - 200 mg/dL   Triglycerides 098 <119 mg/dL   HDL 62 >14 mg/dL   Total CHOL/HDL Ratio 2.3 RATIO   VLDL 23 0 - 40 mg/dL   LDL Cholesterol 58 0 - 99 mg/dL    Comment:        Total Cholesterol/HDL:CHD Risk Coronary Heart Disease Risk Table                     Men   Women  1/2 Average Risk   3.4   3.3  Average Risk       5.0   4.4  2 X Average Risk   9.6   7.1  3 X Average Risk  23.4   11.0        Use the calculated Patient Ratio above and the CHD Risk Table to determine the patient's CHD Risk.        ATP III CLASSIFICATION (LDL):  <100     mg/dL   Optimal  782-956  mg/dL   Near or Above                    Optimal  130-159  mg/dL   Borderline  213-086  mg/dL   High  >578     mg/dL   Very High Performed at [facility], [address], [place], [place] [address]   Comprehensive metabolic panel     Status: Abnormal   Collection Time: 12/04/22  6:31 AM  Result Value Ref Range   Sodium 137 135 - 145 mmol/L   Potassium 3.9 3.5 - 5.1 mmol/L   Chloride 100 98 - 111 mmol/L   CO2 28 22 - 32 mmol/L   Glucose, Bld 93 70 - 99 mg/dL    Comment: Glucose reference range applies only to samples taken after fasting for at least 8  hours.   BUN 9 6 - 20 mg/dL   Creatinine, Ser 2.95 0.61 - 1.24 mg/dL   Calcium 8.7 (L) 8.9 - 10.3 mg/dL   Total Protein 7.5 6.5 - 8.1 g/dL   Albumin 3.9 3.5 - 5.0 g/dL   AST 25 15 - 41 U/L   ALT 19 0 - 44 U/L   Alkaline Phosphatase 57 38 - 126 U/L   Total Bilirubin 0.8 <1.2 mg/dL   GFR, Estimated >28 >41 mL/min    Comment: (NOTE) Calculated using the CKD-EPI Creatinine Equation (2021)    Anion gap 9 5 - 15    Comment: Performed at [facility], [address] [place], [place] [address]    Blood Alcohol level:  Lab Results  Component Value Date   ETH 289 (H) 12/02/2022     Metabolic Disorder Labs: Lab Results  Component Value Date   HGBA1C 4.8 12/04/2022   MPG 91.06 12/04/2022   No results found for: "PROLACTIN" Lab Results  Component Value Date   CHOL 143 12/04/2022   TRIG 116 12/04/2022   HDL 62 12/04/2022   CHOLHDL 2.3 12/04/2022   VLDL 23 12/04/2022   LDLCALC 58 12/04/2022   LDLCALC 76 02/22/2012    Physical Findings: AIMS:  , ,  ,  ,    CIWA:  CIWA-Ar Total: 0 COWS:     Musculoskeletal: Strength & Muscle Tone: within normal limits Gait & Station: normal Patient leans: N/A  Psychiatric Specialty Exam:  General Appearance: appears at stated age, casually dressed and groomed   Behavior: pleasant and cooperative  Psychomotor Activity: slight tremors noted, improved from yesterday  Eye Contact: fair Speech: normal amount, tone, volume and fluency   Mood: euthymic Affect: congruent, pleasant and interactive  Thought Process: linear, goal directed, no circumstantial or tangential thought process noted, no racing thoughts or flight of ideas Descriptions of Associations: intact Thought Content: no bizarre content, logical and future-oriented Hallucinations: denies AH, VH , does not appear responding to stimuli Delusions: no paranoia, delusions of control, grandeur, ideas of reference, thought broadcasting, and magical thinking Suicidal Thoughts: denies SI, intention, plan  Homicidal Thoughts: denies HI, intention, plan   Alertness/Orientation: alert and fully oriented  Insight: fair Judgment: limited  Memory: intact  Executive Functions  Concentration: intact  Attention Span: fair Recall: intact Fund of Knowledge: fair    Agricultural engineer; Desire for Improvement; Housing; Health and safety inspector; Social Support; Transportation    Physical Exam: Constitutional:      Appearance: Normal appearance.  Pulmonary:     Effort: Pulmonary effort is normal.  Neurological:     General: No focal  deficit present.     Mental Status: Alert and oriented to person, place, and time.    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   PLAN: Safety and Monitoring:             -- Involuntary  admission to inpatient psychiatric unit for safety, stabilization and treatment             -- Daily contact with patient to assess and evaluate symptoms and progress in treatment             -- Patient's case to be discussed in multi-disciplinary team meeting             -- Observation Level : q15 minute checks             -- Vital signs:  q12 hours             -- Precautions: suicide, elopement, and assault   2. Medications:               Psychiatric Diagnosis and Treatment   # Bipolar Disorder - Current phase: depressive - Continue Abilify 5 mg daily for depression and anxiety, with plans to increase to 10 mg on 11/6 - Continue Lamictal 50 mg BID for mood stabilization and manic episodes - Decrease Cymbalta from 60 mg daily to 30 mg daily with plans to discontinue on 11/8 for concern of manic episodes and not effective for treating depression - Continue trazodone 50 mg prn nightly for sleep and low suspicion for suicidal risk   # Alcohol Dependency - chronic, severe - Continue CIWA protocol, for CIWA < 10 ativan Q6H - Continue Ativan taper: 1 mg TID --> 0.5 mg BID -->  every day - Continue Thiamine (B1) tablet 100 mg daily  - Plan for SAIOP upon discharge   # Generalized anxiety disorder - Continue Buspar 10 mg BID for anxiety   Atarax 25 mg TID as needed for anxiety Agitation Protocol:    Medical Diagnosis and Treatment   # Pre-diabetic - Repeat CMP shows improved potassium (3.4 --> 3.9) and improved calcium (8.4 --> 8.7) all else WNL - Lipid panel and TSH WNL, A1c 4.8 - Offered that patient could get his wegovy from home to continue course, patient declined due to switching to zepbound and choosing to wait until discharge to continue   Patient in need of nicotine replacement; nicotine patch 14 mg / 24 hours ordered. Smoking cessation encouraged   Other as needed medications  Tylenol 650 mg every 6 hours as needed for pain Mylanta 30 mL every 4 hours as needed for indigestion Milk of magnesia 30 mL daily as needed for constipation       The risks/benefits/side-effects/alternatives to the above medication were discussed in detail with the patient and time was given for questions. The patient consents to medication trial. FDA black box warnings, if present, were discussed.   The patient is agreeable with the medication plan, as above. We will monitor the patient's response to pharmacologic treatment, and adjust medications as necessary.   3. Routine and other pertinent labs: EKG monitoring: 12/02/2022 QTc: 421   Metabolism / endocrine: BMI: Body mass index is 27.24 kg/m.   CBC: WNL CMP: 3.4 K, 21 CO2, 103 glucose, 8.4 calcium, all else WNL UDS: negative Ethanol: 289 TSH: WNL A1c: 4.8     4. Group Therapy:             -- Encouraged patient to participate in unit milieu and in scheduled group therapies              -- Short Term Goals: Ability to identify changes in lifestyle to reduce recurrence of condition will improve, Ability to verbalize feelings will improve, Ability to disclose and discuss  suicidal ideas, Ability to demonstrate  self-control will improve, Ability to identify and develop effective coping behaviors will improve, Ability to maintain clinical measurements within normal limits will improve, Compliance with prescribed medications will improve, and Ability to identify triggers associated with substance abuse/mental health issues will improve             -- Long Term Goals: Improvement in symptoms so as ready for discharge -- Patient is encouraged to participate in group therapy while admitted to the psychiatric unit. -- We will address other chronic and acute stressors, which contributed to the patient's Bipolar disorder (HCC) in order to reduce the risk of self-harm at discharge.   5. Discharge Planning:              -- Social work and case management to assist with discharge planning and identification of hospital follow-up needs prior to discharge             -- Estimated LOS: 5-7 days             -- Discharge Concerns: Need to establish a safety plan; Medication compliance and effectiveness             -- Discharge Goals: Return home with outpatient referrals for mental health follow-up including medication management/psychotherapy     Total Time Spent in Direct Patient Care:  I personally spent 20 minutes on the unit in direct patient care. The direct patient care time included face-to-face time with the patient, reviewing the patient's chart, communicating with other professionals, and coordinating care. Greater than 50% of this time was spent in counseling or coordinating care with the patient regarding goals of hospitalization, psycho-education, and discharge planning needs.   Carolan Shiver, Medical Student 12/05/2022, 7:17 AM

## 2022-12-05 NOTE — Progress Notes (Signed)
Patient verbalizes readiness for discharge. All patient belongings returned to patient. Discharge instructions read and discussed with patient (appointments, medications, resources). Patient expressed gratitude for care provided. Patient discharged to lobby at 1717 where friend was waiting.

## 2022-12-05 NOTE — Plan of Care (Signed)
?  Problem: Education: ?Goal: Emotional status will improve ?Outcome: Progressing ?  ?Problem: Coping: ?Goal: Ability to verbalize frustrations and anger appropriately will improve ?Outcome: Progressing ?  ?Problem: Safety: ?Goal: Periods of time without injury will increase ?Outcome: Progressing ?  ?

## 2022-12-05 NOTE — B[facility] Group Notes (Signed)
Adult Psychoeducational Group Note  Date:  12/05/2022 Time:  11:13 AM  Group Topic/Focus:  Emotional Education:   The focus of this group is to discuss what feelings/emotions are, and how they are experienced. Goals Group:   The focus of this group is to help patients establish daily goals to achieve during treatment and discuss how the patient can incorporate goal setting into their daily lives to aide in recovery.  Participation Level:  Did Not Attend  Participation Quality:  na  Affect:  na  Cognitive:  na  Insight: na  Engagement in Group:  na  Modes of Intervention:  na  Additional Comments:  Pt did not attend group  Juliann Olesky 12/05/2022, 11:13 AM

## 2022-12-05 NOTE — [facility] Group Notes (Signed)
Spiritual care group on grief and loss facilitated by Chaplain Dyanne Carrel, Bcc and Arlyce Dice, Mdiv  Group Goal: Support / Education around grief and loss  Members engage in facilitated group support and psycho-social education.  Group Description:  Following introductions and group rules, group members engaged in facilitated group dialogue and support around topic of loss, with particular support around experiences of loss in their lives. Group Identified types of loss (relationships / self / things) and identified patterns, circumstances, and changes that precipitate losses. Reflected on thoughts / feelings around loss, normalized grief responses, and recognized variety in grief experience. Group encouraged individual reflection on safe space and on the coping skills that they are already utilizing.  Group drew on Adlerian / Rogerian and narrative framework  Patient Progress: Trevor Brewer attended group and actively engaged and participated in group conversation and activities.

## 2022-12-05 NOTE — Progress Notes (Signed)
  [facility] Adult Case Management Discharge Plan :  Will you be returning to the same living situation after discharge:  Yes,  home At discharge, do you have transportation home?: Yes,  Jodelle Green will transport Do you have the ability to pay for your medications: Yes,  has insurance  Release of information consent forms completed and in the chart;  Patient's signature needed at discharge.  Patient to Follow up at:  Follow-up Information     Telford Nab, NP Follow up on 01/18/2023.   Specialty: Behavioral Health Why: You have an appointment for medication management services on 01/18/23 at 9:30 am. Contact information: [address] [place] [address] [phone]         [facility] INTENSIVE CHEMICAL DEPENDENCY. Call.   Specialty: Behavioral Health Why: A referral has been made to this provider for the substance abuse intensive outpatient therapy program.  Please call to schedule an appt. Contact information: [address] [place] [address] [phone]        [facility] Follow up.   Why: You may call this provider for therapy and/or medication management services. Contact information: [address]  [address] [place] [address] [phone]                 Next level of care provider has [facility]:no  Safety Planning and Suicide Prevention discussed: Yes,  Jodelle Green friend     Has patient been referred to the Quitline?: Patient refused referral for treatment  Patient has been referred for addiction treatment: Yes, the patient will follow up with an outpatient provider for substance use disorder. Therapist: appointment made 12/06/2022 at 3pm with Rocky Morel, LCSW 12/05/2022, 4:13 PM
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
## 2022-12-05 NOTE — Progress Notes (Signed)
   12/05/22 0602  15 Minute Checks  Location Bedroom  Visual Appearance Calm  Behavior Composed  Sleep ([facility] Only)  Calculate sleep? (Click Yes once per 24 hr at 0600 safety check) Yes  Documented sleep last 24 hours 7.5

## 2022-12-06 ENCOUNTER — Telehealth (HOSPITAL_COMMUNITY): Payer: Self-pay | Admitting: Licensed Clinical Social Worker

## 2022-12-06 NOTE — Telephone Encounter (Signed)
The therapist contacts Trevor Brewer confirming his identity via two identifiers and informing him that he was told the client has called and had an appointment with this therapist today at 3 p.m. Trevor Brewer says that he called a number given to him by a SW at the hospital and left a voicemail saying that he could not make it today but could come in on Monday.  Thus, the therapist schedules him for an assessment on 12/10/22 at 1 p.m. and provides Trevor Brewer with his direct contact number.  Myrna Blazer, MA, LCSW, [facility], LCAS 12/06/2022

## 2022-12-10 ENCOUNTER — Ambulatory Visit (INDEPENDENT_AMBULATORY_CARE_PROVIDER_SITE_OTHER): Payer: PRIVATE HEALTH INSURANCE

## 2022-12-10 ENCOUNTER — Encounter (HOSPITAL_COMMUNITY): Payer: Self-pay

## 2022-12-10 DIAGNOSIS — Z720 Tobacco use: Secondary | ICD-10-CM

## 2022-12-10 DIAGNOSIS — F1020 Alcohol dependence, uncomplicated: Secondary | ICD-10-CM | POA: Diagnosis not present

## 2022-12-10 DIAGNOSIS — F411 Generalized anxiety disorder: Secondary | ICD-10-CM

## 2022-12-10 DIAGNOSIS — F319 Bipolar disorder, unspecified: Secondary | ICD-10-CM

## 2022-12-10 NOTE — Progress Notes (Signed)
Trevor Brewer presents today after having an Adult Comprehensive Assessment.  He reports he was inpt at [facility].  He was discharged on 12-05-22.  He reports he would like to be in [place].    Alcohol:   First use: 32 years old Amount: 1.5 of tequila Last Use:  Last use was Saturday.  Amount: one shot  Withdrawal symptoms:  tremors  He denies any seizure withdrawal symptoms.  Jordon reports that  he vapes nicotine.    PHQ-9=16 GAD7=12  Trevor Brewer reports he thinks he has always had Bipolar Disorder. 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  He reports he was diagnosed with Generalized Anxiety.   No previous substance use treatment. has had therapy but not consistent.  Has been on Cymbalta previously.  Trevor Brewer meets level II criteria for SAIOP.  This signifies medical necessity for Level II.  Treatment Plan: emplate: Substance Use Disorder         Problem: Substance Use     Dates: Start:  12/10/22       Disciplines: Interdisciplinary, PROVIDER        Goal: Trevor Brewer will maintain sobriety by participating in [place], while attending 12 step meetings, obtaining a sponsor and working on step work.     Dates: Start:  12/10/22    Expected End:  03/12/23       Disciplines: Interdisciplinary, PROVIDER         Outcomes     Date/Time User Outcome    12/10/22 1419 Servando Snare Initial                  Goal: Trevor Brewer will maintain a stable mood, by reporting a 4 or less on the PHQ-9 and the GAD-7.     Dates: Start:  12/10/22    Expected End:  03/12/23       Disciplines: Interdisciplinary, PROVIDER         Outcomes     Date/Time User Outcome    12/10/22 1419 Servando Snare Initial                  Intervention: Therapist will education Trevor Brewer about SUDs, patterns and consequences or use, relapse risks, the treatment process and types of mutual group and provide early recovery, and relapse prevention  skills.     Dates: Start:  12/10/22                Intervention: Therapist will assist Trevor Brewer I identifying thoughts and behaviors that can contribute to feelings of depression and anxiety.     Dates: Start:  12/10/22                  Trevor Brewer will begin SAIOP on 12-21-22.  Remigio Eisenmenger, MS, LMFT, LCAS  12-10-22

## 2022-12-21 ENCOUNTER — Ambulatory Visit (HOSPITAL_COMMUNITY): Payer: PRIVATE HEALTH INSURANCE | Admitting: Medical

## 2022-12-21 ENCOUNTER — Encounter (HOSPITAL_COMMUNITY): Payer: Self-pay | Admitting: Medical

## 2022-12-21 VITALS — BP 130/85 | HR 90 | Ht 70.0 in | Wt 190.0 lb

## 2022-12-21 DIAGNOSIS — F1020 Alcohol dependence, uncomplicated: Secondary | ICD-10-CM

## 2022-12-21 DIAGNOSIS — Z720 Tobacco use: Secondary | ICD-10-CM

## 2022-12-21 DIAGNOSIS — G8929 Other chronic pain: Secondary | ICD-10-CM

## 2022-12-21 DIAGNOSIS — R1013 Epigastric pain: Secondary | ICD-10-CM

## 2022-12-21 DIAGNOSIS — Z6834 Body mass index (BMI) 34.0-34.9, adult: Secondary | ICD-10-CM

## 2022-12-21 DIAGNOSIS — F319 Bipolar disorder, unspecified: Secondary | ICD-10-CM | POA: Diagnosis not present

## 2022-12-21 DIAGNOSIS — E66811 Obesity, class 1: Secondary | ICD-10-CM

## 2022-12-21 DIAGNOSIS — T7491XS Unspecified adult maltreatment, confirmed, sequela: Secondary | ICD-10-CM

## 2022-12-21 DIAGNOSIS — Z6372 Alcoholism and drug addiction in family: Secondary | ICD-10-CM

## 2022-12-21 MED ORDER — BACLOFEN 10 MG PO TABS: 10.0000 mg | ORAL_TABLET | Freq: Three times a day (TID) | ORAL | 1 refills | Status: DC
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
## 2022-12-21 NOTE — Progress Notes (Signed)
Daily Group Progress Note   Program: CD IOP     Group Time: 9:00  am-12 pm      Type of Therapy: Process and Psychoeducational    Topic: The therapists check in with group members, assess for SI/HI/psychosis and overall level of functioning. The therapists inquire about sobriety date and number of community support meetings attended since last session.   The therapists discuss why people struggling with alcohol addiction often do not reveal to their friends that they are not drinking, as if they were to identify as an addiction and being "all in" this would close the door on being able to accept a drink if one is offered.  In other words, one has then "burned their bridges".  Therapist reviews the 3 of the 5 Common Challenges in Early Recovery and discussed the other 2 common early recovery challenges.  The five are as follows:  Friends and Associates that Use, dealing with emotions, substances in the home, boredom  and loneliness and special occasions.   Therapist discusses guilt and shame and the role these play in recovery.   Summary:  This is Trevor Brewer's first group.  He shares that today is his new sobriety date.  He says he drank twice on his trip to [place] and last night he had five shots. He says that is how much alcohol it took to make his face numb. He reports he has not been to any meetings and does not have a sponsor.  He agrees to do a Occupational hygienist.  Trevor Brewer is quiet for the remainder of group.        Progress Towards Goals: Reports he had 5 shots of liquor last evening.   UDS collected:   Results: Negative for drugs and alcohol   AA/NA attended?: no   Sponsor?: no   Remigio Eisenmenger, MS, LMFT, LCAS [address], [place], [place], [facility], LCAS 12/21/2022

## 2022-12-24 ENCOUNTER — Telehealth (HOSPITAL_COMMUNITY): Payer: Self-pay

## 2022-12-24 ENCOUNTER — Encounter (HOSPITAL_COMMUNITY): Payer: Self-pay

## 2022-12-24 ENCOUNTER — Ambulatory Visit (HOSPITAL_COMMUNITY): Payer: PRIVATE HEALTH INSURANCE

## 2022-12-24 NOTE — Telephone Encounter (Signed)
This therapist calls [place] as he was not in CdIOP today.  He did not answer and VM was not set up.  Remigio Eisenmenger, MS., LMFT, LCAS  12/24/22

## 2022-12-26 ENCOUNTER — Ambulatory Visit (INDEPENDENT_AMBULATORY_CARE_PROVIDER_SITE_OTHER): Payer: PRIVATE HEALTH INSURANCE | Admitting: Licensed Clinical Social Worker

## 2022-12-26 DIAGNOSIS — F319 Bipolar disorder, unspecified: Secondary | ICD-10-CM

## 2022-12-26 DIAGNOSIS — F1020 Alcohol dependence, uncomplicated: Secondary | ICD-10-CM | POA: Diagnosis not present

## 2022-12-26 DIAGNOSIS — F411 Generalized anxiety disorder: Secondary | ICD-10-CM

## 2022-12-26 DIAGNOSIS — Z720 Tobacco use: Secondary | ICD-10-CM

## 2022-12-26 NOTE — Progress Notes (Signed)
Daily Group Progress Note   Program: CD IOP     Group Time: 9:00  am-10:20 am      Type of Therapy: Process and Psychoeducational    Topic: The therapist checks in with group members, assesses for SI/HI/psychosis and overall level of functioning. The therapist inquires about sobriety date and number of community support meetings attended since last session.   The therapist asks group members to share their stories of how they came to be in group as part of check-in today. The therapist discusses the importance of people having "social capital" versus material capital in order to find contentment. The therapist discusses the importance of being proactive in getting back to meetings and treatment if a person experiences a slip or relapse suggesting that going back to treatment is not a "failure." The therapist also discloses his belief that people do not "self-sabotage" and that "resistance" in treatment is caused when a clinician attempts to work on his or her goals for the client versus addressing the client's goals.  The therapist talks about seasonal affective disorder, major depression, and anxiety disorders and how they are treated and how self-medicating with drugs and alcohol exacerbates these conditions. The therapist instills hope in sharing the extremely high success rates for treatment clinical depression when a person is compliant with evidence-based treatment. The therapist shares some techniques for overcoming social anxiety.    Summary: [place] presents today rating his depression as a 0 and his anxiety as a 3 or 4.  He has a bandage on one of his fingers saying that he cut his finger cooking last night and had to get 5 stitches.  The therapist observes that [place] is on his cell phone rather than listening to group minding him of the group role concerning no cell phone usage while group is in process.  [place] apologizes saying that he is using his phone to stay awake saying that he  worked a third shift last night so was extremely tired.  After this, he struggles to stay awake having his eyes closed to times.  Just prior to break, informed this therapist that he needs to leave group early in order to make it to a doctor's appointment that he says is scheduled for 11:00.  Progress Towards Goals: [place] does not address his sobriety date.  UDS collected: No results: No   AA/NA attended?:  No   Sponsor?:  No  Alene Mires, [place], [place], [facility], LCAS 12/26/2022

## 2022-12-27 ENCOUNTER — Encounter (HOSPITAL_COMMUNITY): Payer: Self-pay | Admitting: Medical

## 2022-12-28 ENCOUNTER — Encounter (HOSPITAL_COMMUNITY): Payer: Self-pay | Admitting: Medical

## 2022-12-28 ENCOUNTER — Ambulatory Visit (HOSPITAL_COMMUNITY): Payer: PRIVATE HEALTH INSURANCE

## 2022-12-31 ENCOUNTER — Ambulatory Visit (INDEPENDENT_AMBULATORY_CARE_PROVIDER_SITE_OTHER): Payer: PRIVATE HEALTH INSURANCE | Admitting: Licensed Clinical Social Worker

## 2022-12-31 DIAGNOSIS — F1020 Alcohol dependence, uncomplicated: Secondary | ICD-10-CM | POA: Diagnosis not present

## 2022-12-31 DIAGNOSIS — F319 Bipolar disorder, unspecified: Secondary | ICD-10-CM

## 2022-12-31 DIAGNOSIS — F411 Generalized anxiety disorder: Secondary | ICD-10-CM

## 2022-12-31 DIAGNOSIS — Z720 Tobacco use: Secondary | ICD-10-CM
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
## 2023-01-02 ENCOUNTER — Telehealth (HOSPITAL_COMMUNITY): Payer: Self-pay | Admitting: Licensed Clinical Social Worker

## 2023-01-02 ENCOUNTER — Ambulatory Visit (HOSPITAL_COMMUNITY): Payer: PRIVATE HEALTH INSURANCE

## 2023-01-02 ENCOUNTER — Encounter (HOSPITAL_COMMUNITY): Payer: Self-pay

## 2023-01-02 NOTE — Telephone Encounter (Signed)
As [place] does not show for group today, the therapist attempted to reach him by phone leaving a HIPAA compliant voicemail.  Myrna Blazer, MA, LCSW, [facility], LCAS 01/02/2023

## 2023-01-04 ENCOUNTER — Ambulatory Visit (INDEPENDENT_AMBULATORY_CARE_PROVIDER_SITE_OTHER): Payer: PRIVATE HEALTH INSURANCE

## 2023-01-04 DIAGNOSIS — Z720 Tobacco use: Secondary | ICD-10-CM | POA: Diagnosis not present

## 2023-01-04 DIAGNOSIS — F411 Generalized anxiety disorder: Secondary | ICD-10-CM | POA: Diagnosis not present

## 2023-01-04 DIAGNOSIS — F1020 Alcohol dependence, uncomplicated: Secondary | ICD-10-CM | POA: Diagnosis not present

## 2023-01-04 DIAGNOSIS — F319 Bipolar disorder, unspecified: Secondary | ICD-10-CM | POA: Diagnosis not present
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
## 2023-01-07 ENCOUNTER — Ambulatory Visit (INDEPENDENT_AMBULATORY_CARE_PROVIDER_SITE_OTHER): Payer: PRIVATE HEALTH INSURANCE | Admitting: Licensed Clinical Social Worker

## 2023-01-07 DIAGNOSIS — F1020 Alcohol dependence, uncomplicated: Secondary | ICD-10-CM

## 2023-01-07 DIAGNOSIS — Z720 Tobacco use: Secondary | ICD-10-CM

## 2023-01-07 DIAGNOSIS — F319 Bipolar disorder, unspecified: Secondary | ICD-10-CM

## 2023-01-07 DIAGNOSIS — F411 Generalized anxiety disorder: Secondary | ICD-10-CM

## 2023-01-07 NOTE — Progress Notes (Signed)
Daily Group Progress Note   Program: CD IOP     Group Time: 9:00  am-12 pm      Type of Therapy: Process and Psychoeducational    Topic: The therapists check in with group members, assess for SI/HI/psychosis and overall level of functioning. The therapists inquire about sobriety date and number of community support meetings attended since last session.    The therapists facilitate group discussions about the reason that people lie about their using noting that some people lie as they are not motivated to stop using and want to keep people off their backs while others lie as it is part of their using ritual as lying accentuates the high. The therapists discuss the importance of honesty in recovery. Additionally, the therapists emphasize the importance of not wrestling with triggers in the form of people, places, and things given their impairment in their prefrontal cortexes. The therapists educate group members on sober living options such as [employer]    Summary: [place] presents today rating both his depression and anxiety as being a 0.  He describes his mood as being "neutral" and "tired."  [place] appears tired throughout the majority of group as evidenced by his eyes looking heavy and his head being slumped over at times.  He is the only group member to not participate spontaneously in any of the group discussions.  He admits to drinking alcohol on Saturday thinking that he might have had a depressive episode.  He goes on to say that he might have simply been tired as opposed to depressed.  Presently, he works from 7 PM to 7 AM.  He says that he does not want to work dayshift as there are too many people present during dayshift and too Marine scientist.  On a weekly basis, he will go for about 48 hours without sleeping saying that he feels "energized" and spite of not sleeping.  The therapists talk to [place] about his psychotropic medications.  He says that he is currently taking Abilify,  Lamictal, and BuSpar.  He has been taking trazodone for sleep but it is not working and he thinks that he might have become immune to it.  When asked about Seroquel, he says that he has never taken it but adds that he has told his psychiatrist that he wants to avoid weight gaining agents.  He says that he will see his psychiatrist on the 20th and will talk to her about getting on something different so as to regulate his sleep better.  He does note that since being put on Abilify while inpatient that it is working overall much better for him than his previous medication.  When asked about 12-step meetings, [place] says that he has logged onto the website but not attended with the therapists reinforcing this reframing it as a first step in the right direction.   Progress Towards Goals: [place] says that his new sobriety date is yesterday.   UDS collected: Yes Results: Yes, positive for alcohol   AA/NA attended?:  No  Sponsor?:  No   Alene Mires, MA, [place], [facility], LCAS Remigio Eisenmenger, MS, LMFT, LCAS 01/07/2023

## 2023-01-09 ENCOUNTER — Ambulatory Visit (INDEPENDENT_AMBULATORY_CARE_PROVIDER_SITE_OTHER): Payer: PRIVATE HEALTH INSURANCE | Admitting: Licensed Clinical Social Worker

## 2023-01-09 DIAGNOSIS — F1020 Alcohol dependence, uncomplicated: Secondary | ICD-10-CM | POA: Diagnosis not present

## 2023-01-09 DIAGNOSIS — F319 Bipolar disorder, unspecified: Secondary | ICD-10-CM

## 2023-01-09 DIAGNOSIS — Z720 Tobacco use: Secondary | ICD-10-CM

## 2023-01-09 DIAGNOSIS — F411 Generalized anxiety disorder: Secondary | ICD-10-CM

## 2023-01-09 NOTE — Progress Notes (Signed)
Daily Group Progress Note   Program: CD IOP     Group Time: 9:30  am-12 pm      Type of Therapy: Process and Psychoeducational    Topic: The therapists check in with group members, assess for SI/HI/psychosis and overall level of functioning. The therapists inquire about sobriety date and number of community support meetings attended since last session.    The therapists encourage attending 12-step meetings informing group members that people in AA or NA can sometimes be good resources in terms of employment and/or housing resources.  The therapists focus primarily on the topics of honesty and its importance in recovery in addition to the reason that AA, NA, and almost all professionally based substance use treatment programs are abstinence based programs.  The therapists suggest that many people in recovery are unable to explain how addiction is a disease so it is not surprising that family members who do not have the disease of addiction would lack an understanding of this.  The therapists explained the importance of family involvement in treatment whenever possible so that family members can be educated about this and learn how to better support their loved ones who are in recovery.   Summary: [place] presents late today rating both his depression and anxiety as being a 0.  He describes his mood as being "neutral."    He is again the only group member to not participate in today's group discussions keeping his head down throughout much of group while outwardly appearing very tired.  He says that he worked last night and that as a result of working that he is "sleep deprived."  He says that he drank again yesterday and admits that he is struggling with the decision to either quit drinking versus cutting down.  The therapists observe that [place] is not attending any recovery meetings with his primary focus being his work and school.  The therapists ask [place] what he thinks will happen if he  continues to not put his sobriety first in relation to his job with [place] concluding that if he continues to drink that he eventually will not have a job.  He is again encouraged to start making meetings and in addition, it is pointed out that if [place] is capable of successfully being able to cut down on his drinking, then he does not belong in this treatment group.   Progress Towards Goals: [place] says that his new sobriety date is today.   UDS collected: No results: No  AA/NA attended?:  No  Sponsor?:  No   Alene Mires, MA, [place], [facility], LCAS Remigio Eisenmenger, MS, LMFT, LCAS 01/09/2023

## 2023-01-11 ENCOUNTER — Telehealth (HOSPITAL_COMMUNITY): Payer: Self-pay

## 2023-01-11 ENCOUNTER — Ambulatory Visit (HOSPITAL_COMMUNITY): Payer: PRIVATE HEALTH INSURANCE

## 2023-01-11 NOTE — Telephone Encounter (Signed)
Therapist calls Trevor Brewer as he was a No show for CD-IOP today.  No answered and the voice mail has not been set up.  Remigio Eisenmenger, MS, LMFT, LCAS 01/11/23

## 2023-01-14 ENCOUNTER — Ambulatory Visit (INDEPENDENT_AMBULATORY_CARE_PROVIDER_SITE_OTHER): Payer: PRIVATE HEALTH INSURANCE

## 2023-01-14 DIAGNOSIS — F319 Bipolar disorder, unspecified: Secondary | ICD-10-CM

## 2023-01-14 DIAGNOSIS — F411 Generalized anxiety disorder: Secondary | ICD-10-CM

## 2023-01-14 DIAGNOSIS — Z720 Tobacco use: Secondary | ICD-10-CM

## 2023-01-14 DIAGNOSIS — F1020 Alcohol dependence, uncomplicated: Secondary | ICD-10-CM

## 2023-01-14 NOTE — Progress Notes (Deleted)
Daily Group Progress Note   Program: CD IOP     Group Time: 9:15  am-12 pm      Type of Therapy: Process and Psychoeducational    Topic: The therapists check in with group members, assess for SI/HI/psychosis and overall level of functioning. The therapists inquire about sobriety date and number of community support meetings attended since last session.       Summary: Trevor Brewer presents late today rating both his depression and anxiety as being a 2.  He describes his mood as being "depressed," "sad," and "disappointed."    He says that he drank on Saturday and got "way too drunk." This drinking was triggered by friends coming over with alcohol who know Trevor Brewer has been trying to quit drinking. The therapists question what sort of friends would bring alcohol knowing Trevor Brewer is trying to quit.   Trevor Brewer says that he now knows that he needs to stop drinking. He says that he will "attempt" to do a virtual meeting today with it being suggested that he will be more likely to attend meetings if he plans in advance which meetings and what time he is going to attend.   He says that his biggest takeaway from today's group is to "just do it" meaning to not over think meetings, etcetera but to just go. After group, he asks about individual sessions and is reminded that individual therapy is part of the program so schedules individual therapy for this Wednesday immediately after group. He says that there are things he needs to discuss that he cannot talk about in the group.    Progress Towards Goals: Trevor Brewer says that his new sobriety date yesterday.   UDS collected: Yes results: No  AA/NA attended?:  No  Sponsor?:  No   Alene Mires, MA, [place], [facility], LCAS Remigio Eisenmenger, MS, LMFT, LCAS 01/14/2023

## 2023-01-14 NOTE — Progress Notes (Signed)
Daily Group Progress Note   Program: CD IOP     Group Time: 9:15  am-12 pm      Type of Therapy: Process and Psychoeducational    Topic: The therapists check in with group members, assess for SI/HI/psychosis and overall level of functioning. The therapists inquire about sobriety date and number of community support meetings attended since last session.   Therapists share the [employer] Just for today reading entitled "Where There is Smoke" and facilitated discussion from the reading.  The reading involved the issue to complacency and compared it to smoke which is like seeing smoke that clouds things and makes them less clear.  Example would be to clearly see where one is in the steps toward recover. Therapists discuss momentum and prompt discussion on why people lose momentum in recovery. Therapists discuss the issues of how encouragement and accountability can aid one in remaining on the course of recovery. Therapists discuss developing goals and how to break the goal down from "the big picture" to smaller more achievable steps.  This could also help with feeling paralyzed by procrastination. Therapists discuss the role of use and the challenge of learning new behaviors without use. For example, people often use alcohol or drugs in socialization in this society and the challenge to build other sober activities where substances are not present.  Therapists also discuss developing healthy self rewards when making steps toward achievement of a goal.    Summary: Trevor Brewer presents late today rating both his depression and anxiety as being a 2.  He describes his mood as being "depressed," "sad," and "disappointed."    He says that he drank on Saturday and got "way too drunk." This drinking was triggered by friends coming over with alcohol who know Trevor Brewer has been trying to quit drinking. The therapists question what sort of friends would bring alcohol knowing Trevor Brewer is trying to quit.   Trevor Brewer says that he now  knows that he needs to stop drinking. He says that he will "attempt" to do a virtual meeting today with it being suggested that he will be more likely to attend meetings if he plans in advance which meetings and what time he is going to attend.   He says that his biggest takeaway from today's group is to "just do it" meaning to not over think meetings, etcetera but to just go. After group, he asks about individual sessions and is reminded that individual therapy is part of the program so schedules individual therapy for this Wednesday immediately after group. He says that there are things he needs to discuss that he cannot talk about in the group.    Progress Towards Goals: Trevor Brewer says that his new sobriety date yesterday.    UDS collected: Yes results: No  AA/NA attended?:  No  Sponsor?:  No   Alene Mires, MA, [place], [facility], LCAS Remigio Eisenmenger, MS, LMFT, LCAS 01/14/2023

## 2023-01-16 ENCOUNTER — Ambulatory Visit (INDEPENDENT_AMBULATORY_CARE_PROVIDER_SITE_OTHER): Payer: PRIVATE HEALTH INSURANCE | Admitting: Licensed Clinical Social Worker

## 2023-01-16 DIAGNOSIS — F1020 Alcohol dependence, uncomplicated: Secondary | ICD-10-CM | POA: Diagnosis not present

## 2023-01-16 DIAGNOSIS — Z720 Tobacco use: Secondary | ICD-10-CM

## 2023-01-16 DIAGNOSIS — F319 Bipolar disorder, unspecified: Secondary | ICD-10-CM
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
## 2023-01-18 ENCOUNTER — Ambulatory Visit (INDEPENDENT_AMBULATORY_CARE_PROVIDER_SITE_OTHER): Payer: PRIVATE HEALTH INSURANCE

## 2023-01-18 DIAGNOSIS — F1020 Alcohol dependence, uncomplicated: Secondary | ICD-10-CM

## 2023-01-18 DIAGNOSIS — F319 Bipolar disorder, unspecified: Secondary | ICD-10-CM

## 2023-01-18 NOTE — Progress Notes (Signed)
Daily Group Progress Note   Program: CD IOP     Group Time: 9:15  am-12 pm      Type of Therapy: Process and Psychoeducational    Topic: The therapists check in with group members, assess for SI/HI/psychosis and overall level of functioning. The therapists inquire about sobriety date and number of community support meetings attended since last session.   Therapists utilize the material in the [employer] on [place] and Recovery for the group which lists possible issues that could result in use during the holidays. Therapists prompt discussion among the group members regarding which issues may impact them during the holidays. Therapist also briefly discuss Post Acute Withdrawal symptoms and how these impact one's recovery.  Therapist discuss systems therapy and how to interrupt the process that continues the feedback loop of dysfunctional interactions.    Summary: Trevor Brewer presents late today rating both his depression and anxiety as being a 0. Trevor Brewer identifies his emotions as "relaxed and calm".  Trevor Brewer says he attended a Occupational hygienist and found it "interesting".  He says he believes in science and could not relate to the spirituality aspect. He says he glad it helps some people. Therapist explains how 12 step meetings are based on science. Therapist also discuss from a behavioral aspect no one should get sober and recovery really is a miracle.  Therapist explain to Trevor Brewer that he can go to SMART recovery if he prefer and discuss what SMART recovery is.    Progress Towards Goals: Trevor Brewer reports no use of drugs/alcohol   UDS collected: No results: No  AA/NA attended?:  yes, virtual  Sponsor?:  No   Alene Mires, MA, [place], [facility], LCAS Remigio Eisenmenger, MS, LMFT, LCAS 01/18/2023

## 2023-01-21 ENCOUNTER — Ambulatory Visit (INDEPENDENT_AMBULATORY_CARE_PROVIDER_SITE_OTHER): Payer: PRIVATE HEALTH INSURANCE

## 2023-01-21 DIAGNOSIS — F1020 Alcohol dependence, uncomplicated: Secondary | ICD-10-CM

## 2023-01-21 DIAGNOSIS — F319 Bipolar disorder, unspecified: Secondary | ICD-10-CM

## 2023-01-21 NOTE — Progress Notes (Addendum)
Daily Group Progress Note   Program: CD IOP     Group Time: 9:15  am-12 pm      Type of Therapy: Process and Psychoeducational    Topic: The therapists check in with group members, assess for SI/HI/psychosis and overall level of functioning. The therapists inquire about sobriety date and number of community support meetings attended since last session.   Therapists introduce the two new group members and ask other group members to share a bit about themselves, including what brought them to this CD-IOP program. Therapists utilize the [employer] from the [employer]. Therapists ask group members to check the emotions/feelings that may be a trigger using.  Therapists facilitate discussion on group members responses.   Summary: [place] presents late today rating both his depression as a "0" and his anxiety as a "2".  He identifies his emotions as "neutral and ok". He attended a virtual meeting this a.m. He does not have a sponsor. [place] says what led him to CD-IOP was drinking a lot of alcohol. [place] says he like the virtual meeting today and learned that is ok to "let go" of alcohol. [place] identifies some of his internal triggers: frustrated, lonely, overwhelmed. [place] says he has never done anything to deal with his triggers.  He says he thinks that working out would help. He reports he has a facility to go to in order to work out.  [place] shares his plan for Christmas is to go to his parents.  [place] says his father has 8 years sober and he does not think he will be triggered as he reports he has never been triggered at there house.    Progress Towards Goals: [place] reports no use of drugs/alcohol   UDS collected: Yes results: No  AA/NA attended?:  yes, virtual  Sponsor?:  No   Alene Mires, MA, [place], [facility], LCAS Remigio Eisenmenger, [place], LMFT, LCAS 01/21/2023

## 2023-01-25 ENCOUNTER — Ambulatory Visit (INDEPENDENT_AMBULATORY_CARE_PROVIDER_SITE_OTHER): Payer: PRIVATE HEALTH INSURANCE

## 2023-01-25 DIAGNOSIS — F411 Generalized anxiety disorder: Secondary | ICD-10-CM | POA: Diagnosis not present

## 2023-01-25 DIAGNOSIS — F319 Bipolar disorder, unspecified: Secondary | ICD-10-CM

## 2023-01-25 DIAGNOSIS — F1020 Alcohol dependence, uncomplicated: Secondary | ICD-10-CM

## 2023-01-25 NOTE — Progress Notes (Addendum)
Daily Group Progress Note   Program: CD IOP     Group Time: 9:00  am-11 am     Type of Therapy: Process and Psychoeducational    Topic: The therapists check in with group members, assess for SI/HI/psychosis and overall level of functioning. The therapists inquire about sobriety date and number of community support meetings attended since last session.   Therapist introduce the new group member. Therapists utilize the material in the [employer] on External Triggers Questionnaire. Therapists prompts the group on the external triggers they resonate with and discuss settings,activities, situations and setting where group members have used. Therapist also discuss activities, situations settings where groups member would not use and people they could be with an not use.   Therapists discuss other issues as well, including Post Acute Withdrawal Symptoms, Halt (Hungry, angry, lonely and tired as emotions that can trigger use. Therapist discuss assertiveness as a way to channel anger.  Therapist discuss what it means to be an introvert and extrovert and how these may interfere with recovery.   Summary: Trevor Brewer presents late today rating both his depression as a "0" and his anxiety as a "2".  He identifies his emotions as "tired and exhauster". He says he worked a double shift and got off this morning. Trevor Brewer says once the schedule is set, there is not  flexibility to change it for that 6 weeks. Trevor Brewer shares that most of his externa triggers are social events. Trevor Brewer says he has not attended any meetings since our last IOP group and he does not have a sponsor.   Trevor Brewer shares that the situations where he does not use are work and school.  The people he can be around and not use are his parents.    Progress Towards Goals: Trevor Brewer reports no use of drugs/alcohol   UDS collected: No   Results: negative  AA/NA attended?:  No  Sponsor?:  No   Alene Mires, MA, [place], [facility], LCAS Remigio Eisenmenger, MS,  LMFT, LCAS 01/25/2023

## 2023-01-28 ENCOUNTER — Ambulatory Visit (INDEPENDENT_AMBULATORY_CARE_PROVIDER_SITE_OTHER): Payer: PRIVATE HEALTH INSURANCE

## 2023-01-28 DIAGNOSIS — F319 Bipolar disorder, unspecified: Secondary | ICD-10-CM | POA: Diagnosis not present

## 2023-01-28 DIAGNOSIS — F1020 Alcohol dependence, uncomplicated: Secondary | ICD-10-CM | POA: Diagnosis not present

## 2023-01-28 DIAGNOSIS — F411 Generalized anxiety disorder: Secondary | ICD-10-CM | POA: Diagnosis not present

## 2023-01-28 DIAGNOSIS — Z720 Tobacco use: Secondary | ICD-10-CM

## 2023-01-28 NOTE — Progress Notes (Signed)
Daily Group Progress Note   Program: CD IOP     Group Time: 9:00 am-12:00 pm      Type of Therapy: Process and Psychoeducational    Topic: The therapists check in with group members, assess for SI/HI/psychosis and overall level of functioning. The therapists inquire about sobriety date and number of community support meetings attended since last session.   Therapists discuss anger as it relates to recovery today. Therapist use the Matrix Model "Managing Anger" which presents information regarding anger being an emotion, how to recognize when one is angry, positive and negative ways to express anger. Therapists also discuss other emotions from where anger can stem and levels of anger (Annoyance, Frustration, Hostility  and Rage) and the possible expressions of each level. Therapist discuss the importance of processing lower levels of anger so anger does not escalate to Rage. Therapists address the group being rather quiet and ask members to elaborate on why they are in group and what they may need and hope to get out of CD-IOP.   Summary: Trevor Brewer presents late today rating both his depression as a "0" and his anxiety as a "2".  He says he has the same sober date.  Trevor Brewer says he has not attended meetings, nor does he have a sponsor.  Trevor Brewer says when he gets angry, he gets quiet. He also says" I have to find out what drive my drinking".  Therapist asks what he may think it is and he replies, "probably my emotions". Trevor Brewer is quiet for the remainder of the group.    Progress Towards Goals: Trevor Brewer reports no use of drugs/alcohol   UDS collected: Yes   Results: negative fro m 01-21-23 report  AA/NA attended?:  No  Sponsor?:  No    Trevor Eisenmenger, MS, LMFT, LCAS [place], [facility], LCAS 01/28/2023

## 2023-02-01 ENCOUNTER — Ambulatory Visit (INDEPENDENT_AMBULATORY_CARE_PROVIDER_SITE_OTHER): Payer: PRIVATE HEALTH INSURANCE | Admitting: Licensed Clinical Social Worker

## 2023-02-01 DIAGNOSIS — F411 Generalized anxiety disorder: Secondary | ICD-10-CM

## 2023-02-01 DIAGNOSIS — F319 Bipolar disorder, unspecified: Secondary | ICD-10-CM

## 2023-02-01 DIAGNOSIS — Z720 Tobacco use: Secondary | ICD-10-CM

## 2023-02-01 DIAGNOSIS — F1020 Alcohol dependence, uncomplicated: Secondary | ICD-10-CM

## 2023-02-01 NOTE — Progress Notes (Addendum)
 Daily Group Progress Note   Program: CD IOP     Group Time: 9 am-12 pm     Type of Therapy: Process and Psychoeducational    Topic: The therapist checks in with group members, assesses for SI/HI/psychosis and overall level of functioning. The therapist inquires about sobriety date and number of community support meetings attended since last session.   The therapist has group members read and discuss the NA Just for Today reading dealing with Vigilance. The therapist expands extensively on one member's observation that it is important for people in recovery to know they have a disease and to know what a disease is. The therapist has group members complete a questionnaire regarding their perceived level of social support observing that sometimes peoples' perception of support does not match with reality. For example, a person may perceive that his or her using friends are supports only to find that they are a threat to one's recovery.  The therapist shares was research says about recovery taking place in fellowship and elicits responses from group members concerning why meeting attendance in recovery is stressed and what people get out of meetings. The therapist has group members complete the sentence, most people are by answering either trustworthy or not trustworthy.     Summary: Suhaas presents rating his depression as a 0 and anxiety as a 2. He reports feeling neutral. He appears tired as is the norm for him seeming to struggle at times to keep his eyes open.   Gurdeep had the lowest perceived support score in the group and concluded that most people are not trustworthy.   He attended a meeting this morning and says that his partner and friends do not let him drink; however, he reports having a new sobriety date talking about when we were at the bar.  The therapist focuses heavily on the concept of avoiding people, places, and things informing Mirl that if he goes to bars that  it is extremely probable that he is going to drink.    Progress Towards Goals: Kahmari reports no change in his sobriety date.  UDS collected: Yes results: Yes, negative for drugs and alcohol   AA/NA attended?:  Yes  Sponsor?:  No   Elsie Maier, MA, [place], [facility], LCAS Darice Simpler, MS, LMFT, LCAS 02/01/2023

## 2023-02-04 ENCOUNTER — Ambulatory Visit (INDEPENDENT_AMBULATORY_CARE_PROVIDER_SITE_OTHER): Payer: PRIVATE HEALTH INSURANCE

## 2023-02-04 DIAGNOSIS — F1020 Alcohol dependence, uncomplicated: Secondary | ICD-10-CM | POA: Diagnosis not present

## 2023-02-04 DIAGNOSIS — Z720 Tobacco use: Secondary | ICD-10-CM

## 2023-02-04 DIAGNOSIS — F411 Generalized anxiety disorder: Secondary | ICD-10-CM

## 2023-02-04 DIAGNOSIS — F319 Bipolar disorder, unspecified: Secondary | ICD-10-CM

## 2023-02-04 NOTE — Progress Notes (Signed)
 Daily Group Progress Note   Program: CD IOP     Group Time: 9:00 am-12:00 pm      Type of Therapy: Process and Psychoeducational    Topic: The therapists check in with group members, assess for SI/HI/psychosis and overall level of functioning. The therapists inquire about sobriety date and number of community support meetings attended since last session.   Therapists present and prompt discussion on the DBT scills of Distress Tolerance and how this relates to recovery. Therapist discuss provide example of situation/typical thinking and radical thinking. Therapist discuss how to self-soothe one's distressing emotions.  Therapist discuss the Distraction skill of (ACCEPTS) to help one get through painful emotions. The Acronym ACCEPT stands for A(cept), C(ontributing), C(omparisons), E(motions), P(ushing away), T(houghts), S(ensations).  Therapists discuss the importance of self care and how H.A.L.T. (hungry, angry, Lonely and tired) can interfere with self care and lead to slips/relapse   Summary: Trejon rates his depression as a 0 and his anxiety as a 2. He reports the same sobriety date. He identifies his emotions as ok and neutral.Linell says he has not attended a meeting nor does he have a sponsor. Orlandus says he has been too busy with work to attend. Deray also says he does not get adequate sleep.  He says he does not get a full night's sleep. He says he is prescribed 50mg  of Trazadone and only sleeps 5 hours per night and 100 mg leaves him sleep through the next day.  He will check with his provider regarding his dosing or other possible meds.  Mamoudou says there have been many situations that he has fought against and not accepted. Iktan says he has used the self soothe with senses thought using touch to scroll through his cell phone.  He adds that he uses the send of hearing to self soothe to listen to Podcasts. He uses the sense of take to eat his favorite food which is Chinese.  Tanvir shares that he uses the smell sense to self soothe buy using sage.  Dwayne says his take away is that he needs to avoid people, places and things.    Progress Towards Goals: Vallen reports no use of drugs/alcohol    UDS collected: Yes   Results: None  AA/NA attended?:  No  Sponsor?:  No    Darice Simpler, MS, LMFT, LCAS Elsie Maier, [place], [facility] 02/04/23

## 2023-02-06 ENCOUNTER — Ambulatory Visit (INDEPENDENT_AMBULATORY_CARE_PROVIDER_SITE_OTHER): Payer: PRIVATE HEALTH INSURANCE | Admitting: Licensed Clinical Social Worker

## 2023-02-06 DIAGNOSIS — F1020 Alcohol dependence, uncomplicated: Secondary | ICD-10-CM

## 2023-02-06 DIAGNOSIS — Z720 Tobacco use: Secondary | ICD-10-CM

## 2023-02-06 DIAGNOSIS — F319 Bipolar disorder, unspecified: Secondary | ICD-10-CM

## 2023-02-06 DIAGNOSIS — F411 Generalized anxiety disorder: Secondary | ICD-10-CM

## 2023-02-06 NOTE — Progress Notes (Signed)
 Daily Group Progress Note   Program: CD IOP     Group Time: 9 am-12 pm      Type of Therapy: Process and Psychoeducational    Topic: The therapists check in with group members, assess for SI/HI/psychosis and overall level of functioning. The therapists inquire about sobriety date and number of community support meetings attended since last session.    The therapists introduce a new group member.  The therapists educate group members on the family dynamics of addiction and how the family system can sometimes begin to push back against a person's recovery.  Thus, the importance of family therapy is emphasized.  The therapists explained that in order for people to recover that they will have to be willing to get out of their comfort zone and take action.  The therapists inform them that those who have obtained long-term sobriety can provide them with the action plan to follow; however, many people in early recovery are stuck in self-will believing that they do not have to follow these guidelines as they are somehow unique or different from others with the disease of addiction. Individuals who engage in self-sponsorship experience revolving door relapse.    The therapists emphasize that people in early recovery must avoid triggers at all costs and should focus on being smart rather than trying to be strong.  The therapists show a film on the stages of relapse emphasizing the importance of self-care.     Summary: Trevor Brewer presents today rating his depression as a 0 and his anxiety as a 2.  He describes his mood as being neutral.  He shares that he ended up in intensive outpatient treatment as his drinking had become unmanageable such that he ended up in a psychiatric facility.  He says that he attended a virtual meeting this morning but that he is not ready to attend an in person meeting until he overcomes his social anxiety.  The therapist informs him that if he waits to attend until after his  social anxiety has gone away that he will never attend in person.  The therapist explains that he will have to go to some in person meetings scared at first before he will eventually overcome this fear.  The therapist suggest that he could ask a friend to attend with him at an in person open meeting or could attend one of the in person meetings that another group member or members is attending.  When one of the other group members cows him about a meeting that he will attend tomorrow, Trevor Brewer concludes that he could go to this meeting as well.  After viewing the video on the stages of relapse, the therapist makes the observation that Trevor Brewer needs to work on his self-care particularly in getting a sufficient amount of sleep.  He does say that he got 8 hours of sleep last night but that some of it was broken sleep.  progress Towards Goals: Trevor Brewer reports no change in his sobriety date.    UDS collected: Yes Results: No   AA/NA attended?:  Yes   Sponsor?:  No   Trevor Maier, MA, [place], [facility], LCAS Trevor Simpler, MS, LMFT, LCAS 02/06/2023

## 2023-02-08 ENCOUNTER — Ambulatory Visit (INDEPENDENT_AMBULATORY_CARE_PROVIDER_SITE_OTHER): Payer: PRIVATE HEALTH INSURANCE | Admitting: Licensed Clinical Social Worker

## 2023-02-08 DIAGNOSIS — F319 Bipolar disorder, unspecified: Secondary | ICD-10-CM

## 2023-02-08 DIAGNOSIS — F1020 Alcohol dependence, uncomplicated: Secondary | ICD-10-CM

## 2023-02-08 DIAGNOSIS — F411 Generalized anxiety disorder: Secondary | ICD-10-CM

## 2023-02-08 DIAGNOSIS — Z720 Tobacco use: Secondary | ICD-10-CM

## 2023-02-08 NOTE — Progress Notes (Signed)
 Daily Group Progress Note   Program: CD IOP     Group Time: 9 am-12 pm      Type of Therapy: Process and Psychoeducational    Topic: The therapists check in with group members, assess for SI/HI/psychosis and overall level of functioning. The therapists inquire about sobriety date and number of community support meetings attended since last session.    The therapists introduce a new group member. The therapists introduce group members to cognitive therapy and provide a list of twelve types of negative thinking that are obstacles to recovery and six categories of fear facilitating a group discussion on how to identify and changed these.     Summary: Trevor Brewer presents rating his depression as a 0 and his anxiety as a 2. Another group member points out that Trevor Brewer's neutral is not really an emotion. Trevor Brewer concludes that he is content at this time.  He has not attended a meeting with much of the focus on getting Trevor Brewer to take up group members' offers to attend in person meetings with him and to engage in some more exposure therapy in relation to his social anxiety.   Trevor Brewer says that he found the list of thinking obstacles helpful noting that the one with which he has had the biggest issue is that he can have just one drink. He says that his biggest takeaway is that drinking is not the solution.    Progress Towards Goals: Trevor Brewer reports no change in his sobriety date.     UDS collected: No Results: No   AA/NA attended?:  No   Sponsor?:  No   Elsie Maier, MA, [place], [facility], LCAS Darice Simpler, MS, LMFT, LCAS 02/08/2023

## 2023-02-13 ENCOUNTER — Telehealth (HOSPITAL_COMMUNITY): Payer: Self-pay

## 2023-02-13 NOTE — Telephone Encounter (Signed)
 Therapist outreaches Trevor Brewer via telephone as he was a no show in CD-IOP again.  Trevor Brewer has a Recruitment consultant that has not been set up, so therapist could not leave a message.  Trevor Gola, MS, LMFT, LCAS 02-13-23

## 2023-02-21 ENCOUNTER — Encounter: Payer: Self-pay | Admitting: Medical

## 2023-08-13 NOTE — Progress Notes (Signed)
 Patient Name: Trevor Brewer MR#: [id number] DOB: 1990-11-15 Date: 08/13/2023  MED CHECK Follow up Assessment and Medication Management 08/13/2023  Telehealth Video Visit  Location Information: Patient State (at time of visit): [place]  Is provider licensed to provide clinical care in the current location/state of the patient? Yes   Consent:  Patient's identity was confirmed. Presenting condition or illness was discussed with the patient/personal representative. Current proposed treatment for presenting condition or illness was explained to patient/personal representative along with the likely benefits and any significant risks or complications associated with the provision of treatment by audio/video means. The patient/personal representative verbally authorized treatment to be provided by audio/video, which may include a limited review of patient's current health status, medication, or other treatment recommendations, patient education, and an opportunity to ask questions about condition and treatment. Verbal Consent Granted by Patient/Personal Representative:Yes   Visit Information: Modality: 2-Way Real-Time Audio/Video  Video Total Time: 20  Current suicidal/homicidal ideations: No Current auditory/visual hallucinations: No See notes below and follow up in one month. Refill for Abilify  sent to the Pharmacy. Past Psychiatric History    Previous diagnoses: anxiety  Previous psychiatric medication trials: Effexor and Atarax   Past suicidal/homicidal ideation/attempt: NO  Previous psychiatric hospitalizations/Rehab: no  Past Medical History   Patient Active Problem List  Diagnosis  . MRSA infection  . GERD (gastroesophageal reflux disease)  . BMI 37.0-37.9, adult  . Insulin resistance  . Vitamin D deficiency  . Vitamin B12 deficiency  . GAD (generalized anxiety disorder)  . Moderate episode of recurrent major depressive disorder (HCC)  . 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 Tachycardia  . Other abnormal glucose  . Infection of elbow    (CMD)  . Chest pain  . Bipolar disorder, current episode mixed, moderate    (CMD)  . Alcohol  abuse  . Alcohol  use disorder, severe, dependence    (CMD)  . Bipolar 1 disorder, depressed, severe    (CMD)  . Bipolar 1 disorder, mixed, moderate    (CMD)  . Mood disorder  . Encounter for HIV pre-exposure prophylaxis    No Known Allergies Current Outpatient Medications on File Prior to Visit  Medication Sig Dispense Refill  . busPIRone  (BUSPAR ) 15 mg tablet Take 1 tablet by mouth three times a day 90 tablet 0  . cabotegravir ER (APRETUDE) 600 mg/3 mL (200 mg/mL) intramuscular suspension Inject 3 mL (600 mg total) into the muscle Every 2 months. 3 mL 2  . doxycycline  (VIBRAMYCIN ) 100 mg capsule Take 2 capsules (200 mg total) by mouth daily as needed (Take ideally within 24 hours but no more than 72 hours after unprotected sex.). Take with 8 oz water . Do not lie down for at least 30 minutes after. 60 capsule 2  . ergocalciferol (Vitamin D2) 1,250 mcg (50,000 unit) capsule Take 1 capsule (50,000 Units total) by mouth once a week. 12 capsule 1  . lisdexamfetamine (Vyvanse) 50 mg capsule Take 1 capsule (50 mg total) by mouth every morning. 30 capsule 0  . metFORMIN (GLUCOPHAGE-XR) 500 mg 24 hr tablet Take 1 tablet (500 mg total) by mouth daily with breakfast. 90 tablet 1  . omeprazole (PriLOSEC) 40 mg DR capsule Take 1 capsule (40 mg total) by mouth in the morning. 90 capsule 1  . tirzepatide, weight loss, (Zepbound) 15 mg/0.5 mL subcutaneous pen injector Inject 0.5 mL (15 mg total) under the skin every 7 days. 2 mL 3  . topiramate (TOPAMAX) 50  mg tablet Take 1 tabled in the morning and 2 tablets at night 90 tablet 0  . traZODone  (DESYREL ) 100 mg tablet Take 1 tablet (100 mg total) by mouth daily as needed for sleep. 30 tablet 2   No current facility-administered medications on file prior to  visit.    Family History   Family History  Problem Relation Name Age of Onset  . Diabetes Maternal Grandmother    . Hypertension Maternal Grandmother    . Prostate cancer Maternal Grandfather    . Diabetes Paternal Grandmother    . Hypertension Paternal Grandmother        Social History   Social History   Socioeconomic History  . Marital status: Single  Tobacco Use  . Smoking status: Former    Passive exposure: Never  . Smokeless tobacco: Never  Vaping Use  . Vaping status: Every Day  . Substances: Nicotine   Substance and Sexual Activity  . Alcohol  use: Not Currently    Comment: SOCIAL  . Drug use: Never  . Sexual activity: Yes    Birth control/protection: None   Social Drivers of Health   Food Insecurity: Low Risk  (06/05/2023)   Food vital sign   . Within the past 12 months, you worried that your food would run out before you got money to buy more: Never true   . Within the past 12 months, the food you bought just didn't last and you didn't have money to get more: Never true  Transportation Needs: No Transportation Needs (06/05/2023)   Transportation   . In the past 12 months, has lack of reliable transportation kept you from medical appointments, meetings, work or from getting things needed for daily living? : No  Safety: Low Risk  (06/05/2023)   Safety   . How often does anyone, including family and friends, physically hurt you?: Never   . How often does anyone, including family and friends, insult or talk down to you?: Never   . How often does anyone, including family and friends, threaten you with harm?: Never   . How often does anyone, including family and friends, scream or curse at you?: Never  Living Situation: Low Risk  (06/05/2023)   Living Situation   . What is your living situation today?: I have a steady place to live   . Think about the place you live. Do you have problems with any of the following? Choose all that apply:: None/None on this list    Living  situation: Lives with   Family: States Father have history of alcohol , drug abuse, depression ans anxiety and mother also have depression and anxiey  Occupation: Designer, jewellery at [employer] Support: Parents and friends  Trauma hx (sexual, emotional, physical): Denies  Access to firearms: N/A  Substance Use History:  Alcohol : Occasionally Tobacco: Denies Other illicit drug use: Denies  Objective   ROS: All systems negative except pertinent positives documented in HPI. Psych- Depression ans anxiety Neuro-  Sleep pattern disturbances  Physical Exam: There were no vitals taken for this visit.  Lab Review: not applicable  Mental Status Evaluation  Constitutional: General Appearance: Casually dressed General Behavior: Guarded  Musculoskeletal: Gait and Station: UTA- video visit  Strength and Tone: UTA- video visit  Psychiatric: Psychomotor Activity: No motor abnormalities Speech: Normal in rate/volume/tone Mood: Appropriate to circumstances Affect: Appropriate Thought Process: Linear, logical, and goal directed Associations: No loosening of thought Thought Content/Perceptual Disturbances: No evidence of delusions or hallucinations and Denies suicidal/homicidal ideation,  auditory/visual hallucinations, delusions, paranoia, and manic symptoms including: feelings of grandiosity, decreased need for sleep with elevated mood, increased risk taking, racing thoughts, and pressured speech Cognition/Sensorium: Orientation intact (AAO x4) Insight: Good Judgment: Good   Assessment   Encounter Diagnoses  Name Primary?  SABRA GAD (generalized anxiety disorder)   . Mood disorder Yes    Orders Placed This Encounter  Medications  . ARIPiprazole  (ABILIFY ) 5 mg tablet    Sig: Take 1 tablet (5 mg total) by mouth daily.    Dispense:  30 tablet    Refill:  0    Plan  08/13/2023 Trevor was last seen on 07/12/2023. States he is doing well, his medications working a denies side  effects. States he's lost last 15 lbs since reduced Abilify  and that his weight loss dietician started him on Vyvance. .States mood has been stable, and denies SI/HI, denies hallucinations. Rates anxiety and depression at zero. Denies manic symptoms and denies episodes of aggression and mood swings. States he's not been drinking], denies use of marijuana and denies use of illicit. States he's on GLP-1 Zepbound for weight lost. Discussed continuation of current medications and risks of Polypharmacy.   Medications:  1. Trazodone  100 mg - Take 1 tablet at bedtime 2 . Increase Topamax 100 mg- Take two tablets at bedtime.  4.  Abilify  5 mg - Take 1 tablet daily 5.   Buspar  15 mg - Take 1 tablet three a day to further target anxiety. 6. Vyvance 50 mg - Patient on Vyvanse for weight loss, Prescribed by his weight loss doctor Behavioral/Other:  1. Looking for another therapist  Follow-up: 4 weeks  Patient verbalizes understanding and is in agreement with the above plan.  All questions answered.  Medication side effects discussed with patient. Advised patient to call clinic or return for visit if symptoms occur.  Patient contracted for safety.  Knows to call 911, suicide hotline, or present to the ED if suicidal thoughts emer/ge and/or they develop a plan for self-harm.  Patient denies SI and is linear, logical, and future oriented during today's visit.

## 2023-09-18 NOTE — Progress Notes (Signed)
 I have reviewed the notes, assessments, and/or procedures performed by Jori Gent, CMA, I concur with her documentation of Trevor Brewer.

## 2023-09-20 NOTE — Telephone Encounter (Signed)
 Forwarding to [facility] to reschedule in window

## 2023-10-18 NOTE — Telephone Encounter (Signed)
 Notified Mr. Dutch can't make it in today and my [place] availability is limited. He can be rescheduled to see me on main campus or we can do a virtual visit and he can come [place]work/sti [place] once we do the virtual visit. Whichever works best for him. Thank you!

## 2023-10-18 NOTE — Telephone Encounter (Signed)
 1st attempt to contact patient in regards to scheduling an appt with no answer. Generic voice message left for pt to return call to [facility] at [phone], option 3.   Message to ID Team. FYI

## 2023-10-21 NOTE — Telephone Encounter (Signed)
 Call placed to patient, message states wireless customer we are calling is not available, please try call again later.

## 2023-10-23 NOTE — Telephone Encounter (Signed)
 Patient scheduled at [facility] clinic for injection on 10/9

## 2023-10-24 NOTE — Telephone Encounter (Signed)
 Patient called to request the pharmacy be changed for the prescription sent during his visit. Prescription was changed to the new pharmacy. Patient voiced no other concerns.

## 2023-10-25 ENCOUNTER — Emergency Department (HOSPITAL_COMMUNITY): Payer: PRIVATE HEALTH INSURANCE

## 2023-10-25 ENCOUNTER — Inpatient Hospital Stay (HOSPITAL_COMMUNITY)
Admission: EM | Admit: 2023-10-25 | Discharge: 2023-10-29 | DRG: 872 | Disposition: A | Discharge status: Home / Self Care | Payer: PRIVATE HEALTH INSURANCE | Attending: Internal Medicine | Admitting: Internal Medicine

## 2023-10-25 ENCOUNTER — Other Ambulatory Visit: Payer: Self-pay

## 2023-10-25 DIAGNOSIS — K219 Gastro-esophageal reflux disease without esophagitis: Secondary | ICD-10-CM | POA: Diagnosis present

## 2023-10-25 DIAGNOSIS — F17290 Nicotine dependence, other tobacco product, uncomplicated: Secondary | ICD-10-CM | POA: Diagnosis present

## 2023-10-25 DIAGNOSIS — F1010 Alcohol abuse, uncomplicated: Secondary | ICD-10-CM | POA: Diagnosis present

## 2023-10-25 DIAGNOSIS — E871 Hypo-osmolality and hyponatremia: Secondary | ICD-10-CM | POA: Diagnosis present

## 2023-10-25 DIAGNOSIS — E669 Obesity, unspecified: Secondary | ICD-10-CM | POA: Diagnosis present

## 2023-10-25 DIAGNOSIS — Z6830 Body mass index (BMI) 30.0-30.9, adult: Secondary | ICD-10-CM | POA: Diagnosis not present

## 2023-10-25 DIAGNOSIS — Z833 Family history of diabetes mellitus: Secondary | ICD-10-CM | POA: Diagnosis not present

## 2023-10-25 DIAGNOSIS — A419 Sepsis, unspecified organism: Secondary | ICD-10-CM | POA: Diagnosis not present

## 2023-10-25 DIAGNOSIS — F411 Generalized anxiety disorder: Secondary | ICD-10-CM | POA: Diagnosis present

## 2023-10-25 DIAGNOSIS — N390 Urinary tract infection, site not specified: Secondary | ICD-10-CM | POA: Diagnosis present

## 2023-10-25 DIAGNOSIS — Z79899 Other long term (current) drug therapy: Secondary | ICD-10-CM

## 2023-10-25 DIAGNOSIS — E162 Hypoglycemia, unspecified: Secondary | ICD-10-CM | POA: Diagnosis present

## 2023-10-25 DIAGNOSIS — R310 Gross hematuria: Secondary | ICD-10-CM | POA: Diagnosis present

## 2023-10-25 DIAGNOSIS — N410 Acute prostatitis: Secondary | ICD-10-CM | POA: Diagnosis present

## 2023-10-25 DIAGNOSIS — B9689 Other specified bacterial agents as the cause of diseases classified elsewhere: Secondary | ICD-10-CM | POA: Diagnosis present

## 2023-10-25 DIAGNOSIS — A4159 Other Gram-negative sepsis: Principal | ICD-10-CM | POA: Diagnosis present

## 2023-10-25 DIAGNOSIS — M549 Dorsalgia, unspecified: Secondary | ICD-10-CM | POA: Diagnosis present

## 2023-10-25 DIAGNOSIS — Z8249 Family history of ischemic heart disease and other diseases of the circulatory system: Secondary | ICD-10-CM | POA: Diagnosis not present

## 2023-10-25 DIAGNOSIS — R000 Tachycardia, unspecified: Secondary | ICD-10-CM | POA: Diagnosis present

## 2023-10-25 DIAGNOSIS — N419 Inflammatory disease of prostate, unspecified: Secondary | ICD-10-CM | POA: Insufficient documentation

## 2023-10-25 DIAGNOSIS — F314 Bipolar disorder, current episode depressed, severe, without psychotic features: Secondary | ICD-10-CM | POA: Diagnosis present

## 2023-10-25 DIAGNOSIS — F1020 Alcohol dependence, uncomplicated: Secondary | ICD-10-CM | POA: Diagnosis present

## 2023-10-25 DIAGNOSIS — Z1152 Encounter for screening for COVID-19: Secondary | ICD-10-CM

## 2023-10-25 DIAGNOSIS — G8929 Other chronic pain: Secondary | ICD-10-CM | POA: Diagnosis present

## 2023-10-25 DIAGNOSIS — E861 Hypovolemia: Secondary | ICD-10-CM | POA: Diagnosis present

## 2023-10-25 DIAGNOSIS — Z720 Tobacco use: Secondary | ICD-10-CM

## 2023-10-25 HISTORY — DX: Anxiety disorder, unspecified: F41.9

## 2023-10-25 HISTORY — DX: Bipolar disorder, unspecified: F31.9

## 2023-10-25 HISTORY — DX: Depression, unspecified: F32.A

## 2023-10-25 LAB — URINALYSIS, W/ REFLEX TO CULTURE (INFECTION SUSPECTED)
Bacteria, UA: NONE SEEN
Bilirubin Urine: NEGATIVE
Glucose, UA: NEGATIVE mg/dL
Ketones, ur: 5 mg/dL — AB
Leukocytes,Ua: NEGATIVE
Nitrite: POSITIVE — AB
Protein, ur: 30 mg/dL — AB
RBC / HPF: >50 RBC/hpf (ref 0–5)
Specific Gravity, Urine: 1.017 (ref 1.005–1.030)
WBC, UA: >50 WBC/hpf (ref 0–5)
pH: 5 (ref 5.0–8.0)

## 2023-10-25 LAB — CBC WITH DIFFERENTIAL/PLATELET
Abs Immature Granulocytes: 0.11 K/uL — ABNORMAL HIGH (ref 0.00–0.07)
Basophils Absolute: 0 K/uL (ref 0.0–0.1)
Basophils Relative: 0 %
Eosinophils Absolute: 0 K/uL (ref 0.0–0.5)
Eosinophils Relative: 0 %
HCT: 50 % (ref 39.0–52.0)
Hemoglobin: 16 g/dL (ref 13.0–17.0)
Immature Granulocytes: 1 %
Lymphocytes Relative: 3 %
Lymphs Abs: 0.4 K/uL — ABNORMAL LOW (ref 0.7–4.0)
MCH: 27.5 pg (ref 26.0–34.0)
MCHC: 32 g/dL (ref 30.0–36.0)
MCV: 85.9 fL (ref 80.0–100.0)
Monocytes Absolute: 0.3 K/uL (ref 0.1–1.0)
Monocytes Relative: 2 %
Neutro Abs: 14.1 K/uL — ABNORMAL HIGH (ref 1.7–7.7)
Neutrophils Relative %: 94 %
Platelets: 330 K/uL (ref 150–400)
RBC: 5.82 MIL/uL — ABNORMAL HIGH (ref 4.22–5.81)
RDW: 14.4 % (ref 11.5–15.5)
WBC: 14.9 K/uL — ABNORMAL HIGH (ref 4.0–10.5)
nRBC: 0 % (ref 0.0–0.2)

## 2023-10-25 LAB — COMPREHENSIVE METABOLIC PANEL WITH GFR
ALT: 22 U/L (ref 0–44)
AST: 24 U/L (ref 15–41)
Albumin: 4.5 g/dL (ref 3.5–5.0)
Alkaline Phosphatase: 89 U/L (ref 38–126)
Anion gap: 15 (ref 5–15)
BUN: 12 mg/dL (ref 6–20)
CO2: 21 mmol/L — ABNORMAL LOW (ref 22–32)
Calcium: 9.4 mg/dL (ref 8.9–10.3)
Chloride: 97 mmol/L — ABNORMAL LOW (ref 98–111)
Creatinine, Ser: 0.9 mg/dL (ref 0.61–1.24)
GFR, Estimated: >60 mL/min (ref >60)
Glucose, Bld: 86 mg/dL (ref 70–99)
Potassium: 4.3 mmol/L (ref 3.5–5.1)
Sodium: 133 mmol/L — ABNORMAL LOW (ref 135–145)
Total Bilirubin: 1.6 mg/dL — ABNORMAL HIGH (ref 0.0–1.2)
Total Protein: 8 g/dL (ref 6.5–8.1)

## 2023-10-25 LAB — PROTIME-INR
INR: 1 (ref 0.8–1.2)
Prothrombin Time: 13.6 s (ref 11.4–15.2)

## 2023-10-25 LAB — URINE DRUG SCREEN
Amphetamines: NEGATIVE
Barbiturates: NEGATIVE
Benzodiazepines: NEGATIVE
Cocaine: NEGATIVE
Fentanyl: NEGATIVE
Methadone Scn, Ur: NEGATIVE
Opiates: NEGATIVE
Tetrahydrocannabinol: NEGATIVE

## 2023-10-25 LAB — I-STAT CG4 LACTIC ACID, ED
Lactic Acid, Venous: 1.1 mmol/L (ref 0.5–1.9)
Lactic Acid, Venous: 1.1 mmol/L (ref 0.5–1.9)

## 2023-10-25 LAB — I-STAT CHEM 8, ED
BUN: 12 mg/dL (ref 6–20)
Calcium, Ion: 1.12 mmol/L — ABNORMAL LOW (ref 1.15–1.40)
Chloride: 99 mmol/L (ref 98–111)
Creatinine, Ser: 0.9 mg/dL (ref 0.61–1.24)
Glucose, Bld: 87 mg/dL (ref 70–99)
HCT: 51 % (ref 39.0–52.0)
Hemoglobin: 17.3 g/dL — ABNORMAL HIGH (ref 13.0–17.0)
Potassium: 4.1 mmol/L (ref 3.5–5.1)
Sodium: 134 mmol/L — ABNORMAL LOW (ref 135–145)
TCO2: 21 mmol/L — ABNORMAL LOW (ref 22–32)

## 2023-10-25 LAB — HIV ANTIBODY (ROUTINE TESTING W REFLEX): HIV Screen 4th Generation wRfx: NONREACTIVE

## 2023-10-25 LAB — MAGNESIUM: Magnesium: 2 mg/dL (ref 1.7–2.4)

## 2023-10-25 LAB — TSH: TSH: 0.965 u[IU]/mL (ref 0.350–4.500)

## 2023-10-25 LAB — ETHANOL: Alcohol, Ethyl (B): <15 mg/dL (ref <15)

## 2023-10-25 MED ORDER — TRAZODONE HCL 50 MG PO TABS
50.0000 mg | ORAL_TABLET | Freq: Every evening | ORAL | Status: DC | PRN
Start: 2023-10-25 — End: 2023-10-29
  Administered 2023-10-25 – 2023-10-28 (×4): 50 mg via ORAL
  Filled 2023-10-25 (×4): qty 1

## 2023-10-25 MED ORDER — PIPERACILLIN-TAZOBACTAM 3.375 G IVPB
3.3750 g | Freq: Three times a day (TID) | INTRAVENOUS | Status: DC
  Administered 2023-10-25 – 2023-10-28 (×9): 3.375 g via INTRAVENOUS
  Filled 2023-10-25 (×10): qty 50

## 2023-10-25 MED ORDER — LACTATED RINGERS IV BOLUS (SEPSIS)
1000.0000 mL | Freq: Once | INTRAVENOUS | Status: AC
  Administered 2023-10-25: 1000 mL via INTRAVENOUS

## 2023-10-25 MED ORDER — ACETAMINOPHEN 500 MG PO TABS
ORAL_TABLET | ORAL | Status: AC
  Administered 2023-10-25: 1000 mg via ORAL
  Filled 2023-10-25: qty 2

## 2023-10-25 MED ORDER — ADULT MULTIVITAMIN W/MINERALS CH
1.0000 | ORAL_TABLET | Freq: Every day | ORAL | Status: DC
Start: 2023-10-25 — End: 2023-10-29
  Administered 2023-10-25 – 2023-10-28 (×4): 1 via ORAL
  Filled 2023-10-25 (×5): qty 1

## 2023-10-25 MED ORDER — ACETAMINOPHEN 500 MG PO TABS: 1000.0000 mg | ORAL_TABLET | Freq: Once | ORAL | Status: AC

## 2023-10-25 MED ORDER — THIAMINE MONONITRATE 100 MG PO TABS
100.0000 mg | ORAL_TABLET | Freq: Every day | ORAL | Status: DC
  Administered 2023-10-25 – 2023-10-28 (×4): 100 mg via ORAL
  Filled 2023-10-25 (×5): qty 1

## 2023-10-25 MED ORDER — ONDANSETRON HCL 4 MG PO TABS: 4.0000 mg | ORAL_TABLET | Freq: Four times a day (QID) | ORAL | Status: DC | PRN

## 2023-10-25 MED ORDER — PANTOPRAZOLE SODIUM 40 MG PO TBEC
80.0000 mg | DELAYED_RELEASE_TABLET | Freq: Every day | ORAL | Status: DC
  Administered 2023-10-25 – 2023-10-29 (×5): 80 mg via ORAL
  Filled 2023-10-25 (×6): qty 2

## 2023-10-25 MED ORDER — CEFTRIAXONE SODIUM 2 G IJ SOLR
2.0000 g | Freq: Once | INTRAMUSCULAR | Status: AC
  Administered 2023-10-25: 2 g via INTRAVENOUS
  Filled 2023-10-25: qty 20

## 2023-10-25 MED ORDER — ADULT MULTIVITAMIN W/MINERALS CH: 1.0000 | ORAL_TABLET | Freq: Every day | ORAL | Status: DC

## 2023-10-25 MED ORDER — ENOXAPARIN SODIUM 40 MG/0.4ML IJ SOSY
40.0000 mg | PREFILLED_SYRINGE | INTRAMUSCULAR | Status: DC
  Filled 2023-10-25 (×2): qty 0.4

## 2023-10-25 MED ORDER — HYDROCODONE-ACETAMINOPHEN 5-325 MG PO TABS: 1.0000 | ORAL_TABLET | ORAL | Status: DC | PRN

## 2023-10-25 MED ORDER — BACLOFEN 10 MG PO TABS: 10.0000 mg | ORAL_TABLET | Freq: Three times a day (TID) | ORAL | Status: DC | PRN

## 2023-10-25 MED ORDER — ACETAMINOPHEN 650 MG RE SUPP: 650.0000 mg | Freq: Four times a day (QID) | RECTAL | Status: DC | PRN

## 2023-10-25 MED ORDER — FOLIC ACID 1 MG PO TABS
1.0000 mg | ORAL_TABLET | Freq: Every day | ORAL | Status: DC
  Administered 2023-10-25 – 2023-10-28 (×4): 1 mg via ORAL
  Filled 2023-10-25 (×5): qty 1

## 2023-10-25 MED ORDER — ACETAMINOPHEN 325 MG PO TABS
650.0000 mg | ORAL_TABLET | Freq: Four times a day (QID) | ORAL | Status: DC | PRN
  Administered 2023-10-25 – 2023-10-27 (×5): 650 mg via ORAL
  Filled 2023-10-25 (×5): qty 2

## 2023-10-25 MED ORDER — ONDANSETRON HCL 4 MG/2ML IJ SOLN: 4.0000 mg | Freq: Four times a day (QID) | INTRAMUSCULAR | Status: DC | PRN

## 2023-10-25 MED ORDER — LACTATED RINGERS IV SOLN: INTRAVENOUS | Status: DC

## 2023-10-25 MED ORDER — NICOTINE 7 MG/24HR TD PT24
7.0000 mg | MEDICATED_PATCH | Freq: Every day | TRANSDERMAL | Status: DC
  Administered 2023-10-25: 7 mg via TRANSDERMAL
  Filled 2023-10-25 (×3): qty 1

## 2023-10-25 NOTE — Assessment & Plan Note (Signed)
 Drinks socially, denies any abuse Ethanol negative MV, thiamine  and folic acid 

## 2023-10-25 NOTE — Assessment & Plan Note (Signed)
 Off all medication currently F/u outpatient

## 2023-10-25 NOTE — Assessment & Plan Note (Signed)
 Nicotine  patch

## 2023-10-25 NOTE — H&P (Signed)
 History and Physical    Patient: Trevor Brewer FMW:[id number] DOB: [birth year] DOA: 10/25/2023 DOS: the patient was seen and examined on 10/25/2023 PCP: Nikki Rams, Aliene, MD  Patient coming from: Home - lives with his partner.    Chief Complaint: fever and urinary symptoms.   HPI: Trevor Brewer is a 33 y.o. male with medical history significant of GERD, alcohol  use, bipolar 2 and GAD who presented to ED with fever and worsening symptoms after being seen in urgent care yesterday and being treated for a UTI with macrobid. He tells me Wednesday he started to feel achy and started to drink emergen C and felt better. The next day he started to feel feverish, achy and had mild dysuria and some urgency and frequency. He also has some gross hematuria. He went to urgent care and he was given the macrobid and pyridium. He never picked them up because pharmacy didn't have it. He went home and took tylenol  and felt better. He woke up today and got his medications this morning and took them. Around noon he started to feel achy and fatigued. Took 600mg  of ibuprofen  and started to get worse. He started to feel really bad around 1-2pm and noticed his heart rate high (160-170) and his temp was 103.8 and prompted him to come to ED. He has lower back, but states he has chronic back pain. He has some new back pain, but not in CVA area. He has no pain in his anal area. No pain with defecation. No pain with ejaculation and no blood in semen. No penile drainage. He has had 2 urinary infections back in 2021.   Denies any  vision changes, +headaches, chest pain or palpitations, shortness of breath or cough, abdominal pain, N/V/D, leg swelling.   He vapes daily. Drinks alcohol , not daily.   ER Course:  temp: 103.2, bp: 149/108, HR: 164, RR: 18, oxygen: 97%RA Pertinent labs: wbc: 14.9, lactic acid: 1.1, UA with nitrite, RBC, WBC and ketones.  CT renal stone study: no acute findings in abdomen. Mild prominence of the  prostate gland with ill-defined margins and hazy appearance of the adjacent fat. These findings could be seen in acute prostatitis. 3. Couple punctate nonobstructing stones over the lower pole right kidney. 4. Diverticulosis of the descending colon CXR: no acute findings In ED: code sepsis. Given 1g tylenol , 30mg /kg IVF bolus and continued IVF, BC and urine culture and 2g of rocephin . TRH asked to admit.     Review of Systems: As mentioned in the history of present illness. All other systems reviewed and are negative. Past Medical History:  Diagnosis Date   GERD (gastroesophageal reflux disease)    Past Surgical History:  Procedure Laterality Date   arm surgery     fracture   Social History:  reports that he has never smoked. He has never used smokeless tobacco. He reports current alcohol  use of about 3.0 standard drinks of alcohol  per week. He reports that he does not use drugs.  No Known Allergies  Family History  Problem Relation Age of Onset   Diabetes Paternal Uncle    Hypertension Paternal Uncle    Alcohol  abuse Neg Hx    Cancer Neg Hx    Early death Neg Hx    Heart disease Neg Hx    Hyperlipidemia Neg Hx    Kidney disease Neg Hx    Stroke Neg Hx     Prior to Admission medications   Medication Sig Start Date End Date Taking?  Authorizing Provider  ARIPiprazole  (ABILIFY ) 10 MG tablet Take 1 tablet (10 mg total) by mouth daily. 12/06/22   Leigh Corean Massa, MD  baclofen  (LIORESAL ) 10 MG tablet Take 1 tablet (10 mg total) by mouth 3 (three) times daily. 12/21/22 12/21/23  Leila Carlin BRAVO, PA-C  busPIRone  (BUSPAR ) 10 MG tablet Take 1 tablet (10 mg total) by mouth 2 (two) times daily. 12/05/22   Leigh Corean Massa, MD  DULoxetine  (CYMBALTA ) 30 MG capsule Take 1 capsule (30 mg total) by mouth daily. Take one tablet by mouth daily until gone, and then stop 12/06/22   Leigh Corean Massa, MD  hydrOXYzine  (ATARAX ) 25 MG tablet Take 1 tablet (25 mg total) by mouth 3  (three) times daily as needed for anxiety. 12/05/22   Leigh Corean Massa, MD  lamoTRIgine  (LAMICTAL ) 25 MG tablet Take 2 tablets (50 mg total) by mouth 2 (two) times daily. 12/05/22   Leigh Corean Massa, MD  Multiple Vitamin (MULTIVITAMIN WITH MINERALS) TABS tablet Take 1 tablet by mouth daily. 12/06/22   Leigh Corean Massa, MD  traZODone  (DESYREL ) 50 MG tablet Take 1 tablet (50 mg total) by mouth at bedtime as needed for sleep. 12/05/22   Hill, Corean Massa, MD  WEGOVY 2.4 MG/0.75ML SOAJ Inject 2.4 mg into the skin once a week. 11/06/22   [provider]    Physical Exam: Vitals:   10/25/23 1915 10/25/23 1918 10/25/23 1946 10/25/23 2100  BP: 115/71  102/73   Pulse: (!) 125 (!) 129 (!) 112   Resp: (!) 36 19 20   Temp:   (!) 101.2 F (38.4 C)   TempSrc:   Oral   SpO2: 99% 100% 100%   Weight:    97.8 kg  Height:    5' 10 (1.778 m)   General:  Appears calm and comfortable and is in NAD. Diaphoretic  Eyes:  PERRL, EOMI, normal lids, iris ENT:  grossly normal hearing, lips & tongue, mmm; appropriate dentition Neck:  no LAD, masses or thyromegaly; no carotid bruits Cardiovascular:  RRR, no m/r/g. No LE edema.  Respiratory:   CTA bilaterally with no wheezes/rales/rhonchi.  Normal respiratory effort. Abdomen:  soft, NT, ND, NABS Back:   normal alignment, no CVAT Skin:  no rash or induration seen on limited exam Musculoskeletal:  grossly normal tone BUE/BLE, good ROM, no bony abnormality Lower extremity:  No LE edema.  Limited foot exam with no ulcerations.  2+ distal pulses. Psychiatric:  grossly normal mood and affect, speech fluent and appropriate, AOx3 Neurologic:  CN 2-12 grossly intact, moves all extremities in coordinated fashion, sensation intact   Radiological Exams on Admission: Independently reviewed - see discussion in A/P where applicable  CT Renal Stone Study Result Date: 10/25/2023 CLINICAL DATA:  Abdominal/flank pain. Possible renal stone. Being treated  for UTI. EXAM: CT ABDOMEN AND PELVIS WITHOUT CONTRAST TECHNIQUE: Multidetector CT imaging of the abdomen and pelvis was performed following the standard protocol without IV contrast. RADIATION DOSE REDUCTION: This exam was performed according to the departmental dose-optimization program which includes automated exposure control, adjustment of the mA and/or kV according to patient size and/or use of iterative reconstruction technique. COMPARISON:  None Available. FINDINGS: Lower chest: Heart size is normal.  Lung bases are clear. Hepatobiliary: Liver, gallbladder and biliary tree are normal. Pancreas: Normal. Spleen: Normal. Adrenals/Urinary Tract: Adrenal glands are normal. Kidneys are normal in size. Couple punctate nonobstructing stones over the lower pole right kidney. No left-sided renal stones. No hydronephrosis. Ureters and bladder are  unremarkable. Stomach/Bowel: Stomach and small bowel are normal. Appendix is normal. Diverticulosis of the descending colon. Vascular/Lymphatic: Abdominal aorta is normal in caliber. Remaining vascular structures are unremarkable. No adenopathy. Reproductive: Mild prominence of the prostate gland with slightly ill-defined margins and slight hazy appearance of the adjacent fat. These findings could be seen in acute prostatitis. Other: No free peritoneal fluid. Musculoskeletal: No focal abnormality. IMPRESSION: 1. No acute findings in the abdomen. 2. Mild prominence of the prostate gland with ill-defined margins and hazy appearance of the adjacent fat. These findings could be seen in acute prostatitis. 3. Couple punctate nonobstructing stones over the lower pole right kidney. 4. Diverticulosis of the descending colon. Electronically Signed   By: Toribio Agreste M.D.   On: 10/25/2023 16:44   DG Chest Port 1 View Result Date: 10/25/2023 CLINICAL DATA:  Questionable sepsis - evaluate for abnormality EXAM: PORTABLE CHEST - 1 VIEW COMPARISON:  Jun 19, 2012 FINDINGS: Low lung volumes  no focal airspace consolidation, pleural effusion, or pneumothorax. No cardiomegaly.No acute fracture or destructive lesion. IMPRESSION: Low lung volumes.  Otherwise, no acute cardiopulmonary abnormality. Electronically Signed   By: Rogelia Myers M.D.   On: 10/25/2023 16:38    EKG: Independently reviewed.  Sinus tachycardia with rate 172; nonspecific ST changes with no evidence of acute ischemia   Labs on Admission: I have personally reviewed the available labs and imaging studies at the time of the admission.  Pertinent labs:   wbc: 14.9,  lactic acid: 1.1,  UA with nitrite, RBC, WBC and ketones.   Assessment and Plan: Principal Problem:   Sepsis secondary to UTI and prostatitis Active Problems:   GERD (gastroesophageal reflux disease)   Bipolar 1 disorder, depressed, severe (HCC)   history of alcohol  abuse   Obesity (BMI 30-39.9)   Vapes nicotine  containing substance   Prostatitis    Assessment and Plan: * Sepsis secondary to UTI and prostatitis 33 year old presenting to ED with complaints of fever and dysuria with gross hematuria x 2 days found to have sepsis with tmax of 103.2, tachycardia and leukocytosis secondary to UTI and possible prostatitis  -admit to progressive -received appropriate sepsis protocol IVF in ED -continue IVF at 150cc/hour -pan cultured  -lactic acid wnl  -received rocephin  in ED, change to zosyn  for prostatitis and UTI coverage  -check GC/C urine cytology  -anti pyretics  -trend CBC   GERD (gastroesophageal reflux disease) Continue PPI   Bipolar 1 disorder, depressed, severe (HCC) Off all medication currently F/u outpatient   history of alcohol  abuse Drinks socially, denies any abuse Ethanol negative MV, thiamine  and folic acid    Obesity (BMI 30-39.9) Hold vyvanse, metformin and GLP-1   Vapes nicotine  containing substance Nicotine  patch     Advance Care Planning:   Code Status: Full Code   Consults: none   DVT Prophylaxis:  lovenox    Family Communication: none   Severity of Illness: The appropriate patient status for this patient is INPATIENT. Inpatient status is judged to be reasonable and necessary in order to provide the required intensity of service to ensure the patient's safety. The patient's presenting symptoms, physical exam findings, and initial radiographic and laboratory data in the context of their chronic comorbidities is felt to place them at high risk for further clinical deterioration. Furthermore, it is not anticipated that the patient will be medically stable for discharge from the hospital within 2 midnights of admission.   * I certify that at the point of admission it is my  clinical judgment that the patient will require inpatient hospital care spanning beyond 2 midnights from the point of admission due to high intensity of service, high risk for further deterioration and high frequency of surveillance required.*  Author: Isaiah Geralds, MD 10/25/2023 10:52 PM  For on call review [email or web address].

## 2023-10-25 NOTE — ED Triage Notes (Signed)
 Being treated for UTI, but started feeling worse with fevers of 103F. HR now in 160's (radially). C/o aches, sweaty. Took Ibuprofen  around noon.

## 2023-10-25 NOTE — Assessment & Plan Note (Signed)
 Continue PPI.

## 2023-10-25 NOTE — Assessment & Plan Note (Addendum)
 33 year old presenting to ED with complaints of fever and dysuria with gross hematuria x 2 days found to have sepsis with tmax of 103.2, tachycardia and leukocytosis secondary to UTI and possible prostatitis  -admit to progressive -received appropriate sepsis protocol IVF in ED -continue IVF at 150cc/hour -pan cultured  -lactic acid wnl  -received rocephin  in ED, change to zosyn  for prostatitis and UTI coverage  -check GC/C urine cytology  -anti pyretics  -trend CBC

## 2023-10-25 NOTE — Sepsis Progress Note (Signed)
 Code sepsis protocol being monitored by eLink.

## 2023-10-25 NOTE — ED Provider Notes (Signed)
 [place] EMERGENCY DEPARTMENT AT [facility] Provider Note   CSN: [id number] Arrival date & time: 10/25/23  1517     Patient presents with: Tachycardia   Trevor Brewer is a 33 y.o. male.   33 year old male presents with worsening UTI symptoms.  Patient was seen in urgent care yesterday and diagnosed with UTI.  Was placed on Macrobid states that he noticed his heart rate yesterday was 135.  He has been using over-the-counter medications.  Denies any URI symptoms.  He has not had any vomiting or diarrhea.  No severe headache or photophobia.  No neck discomfort.  States today he began to have increasing myalgias.  Had a negative COVID and flu test at home.       Prior to Admission medications   Medication Sig Start Date End Date Taking? Authorizing Provider  ARIPiprazole  (ABILIFY ) 10 MG tablet Take 1 tablet (10 mg total) by mouth daily. 12/06/22   Leigh Corean Massa, MD  baclofen  (LIORESAL ) 10 MG tablet Take 1 tablet (10 mg total) by mouth 3 (three) times daily. 12/21/22 12/21/23  Leila Carlin BRAVO, PA-C  busPIRone  (BUSPAR ) 10 MG tablet Take 1 tablet (10 mg total) by mouth 2 (two) times daily. 12/05/22   Leigh Corean Massa, MD  DULoxetine  (CYMBALTA ) 30 MG capsule Take 1 capsule (30 mg total) by mouth daily. Take one tablet by mouth daily until gone, and then stop 12/06/22   Leigh Corean Massa, MD  hydrOXYzine  (ATARAX ) 25 MG tablet Take 1 tablet (25 mg total) by mouth 3 (three) times daily as needed for anxiety. 12/05/22   Leigh Corean Massa, MD  lamoTRIgine  (LAMICTAL ) 25 MG tablet Take 2 tablets (50 mg total) by mouth 2 (two) times daily. 12/05/22   Leigh Corean Massa, MD  Multiple Vitamin (MULTIVITAMIN WITH MINERALS) TABS tablet Take 1 tablet by mouth daily. 12/06/22   Leigh Corean Massa, MD  traZODone  (DESYREL ) 50 MG tablet Take 1 tablet (50 mg total) by mouth at bedtime as needed for sleep. 12/05/22   Hill, Corean Massa, MD  WEGOVY 2.4 MG/0.75ML SOAJ Inject  2.4 mg into the skin once a week. 11/06/22   [provider]    Allergies: Patient has no known allergies.    Review of Systems  All other systems reviewed and are negative.   Updated Vital Signs BP (!) 149/108 (BP Location: Right Arm)   Pulse (S) (!) 164   Temp (!) 103.2 F (39.6 C) (Oral)   Resp 18   SpO2 97%   Physical Exam Vitals and nursing note reviewed.  Constitutional:      General: He is not in acute distress.    Appearance: Normal appearance. He is well-developed. He is not toxic-appearing.  HENT:     Head: Normocephalic and atraumatic.  Eyes:     General: Lids are normal.     Conjunctiva/sclera: Conjunctivae normal.     Pupils: Pupils are equal, round, and reactive to light.  Neck:     Thyroid: No thyroid mass.     Trachea: No tracheal deviation.  Cardiovascular:     Rate and Rhythm: Regular rhythm. Tachycardia present.     Heart sounds: Normal heart sounds. No murmur heard.    No gallop.  Pulmonary:     Effort: Pulmonary effort is normal. No respiratory distress.     Breath sounds: Normal breath sounds. No stridor. No decreased breath sounds, wheezing, rhonchi or rales.  Abdominal:     General: There is no distension.  Palpations: Abdomen is soft.     Tenderness: There is no abdominal tenderness. There is no rebound.  Musculoskeletal:        General: No tenderness. Normal range of motion.     Cervical back: Normal range of motion and neck supple.  Skin:    General: Skin is warm and dry.     Findings: No abrasion or rash.  Neurological:     Mental Status: He is alert and oriented to person, place, and time. Mental status is at baseline.     GCS: GCS eye subscore is 4. GCS verbal subscore is 5. GCS motor subscore is 6.     Cranial Nerves: No cranial nerve deficit.     Sensory: No sensory deficit.     Motor: Motor function is intact.  Psychiatric:        Attention and Perception: Attention normal.        Speech: Speech normal.         Behavior: Behavior normal.     (all labs ordered are listed, but only abnormal results are displayed) Labs Reviewed  CULTURE, BLOOD (ROUTINE X 2)  CULTURE, BLOOD (ROUTINE X 2)  COMPREHENSIVE METABOLIC PANEL WITH GFR  CBC WITH DIFFERENTIAL/PLATELET  PROTIME-INR  URINALYSIS, W/ REFLEX TO CULTURE (INFECTION SUSPECTED)  I-STAT CG4 LACTIC ACID, ED    EKG: None  Radiology: No results found.   Procedures   Medications Ordered in the ED  acetaminophen  (TYLENOL ) tablet 1,000 mg (has no administration in time range)                                    Medical Decision Making Amount and/or Complexity of Data Reviewed Labs: ordered. Radiology: ordered.  Risk OTC drugs. Prescription drug management.  Patient is EKG shows sinus tachycardia. Code sepsis started when patient arrived.  Patient given IV fluid bolus with 30 cc/kg.  Rate did improved after IV hydration.  Chest x-ray performed potation shows no acute findings.  Started on Rocephin  2 g due to no urinary source.  Urinalysis did confirm infection.  Significant leukocytosis noted at 15,000.  Lactate normal.  Renal CT showed evidence of likely prostatitis as cause of his current symptoms.  Lactate was normal.  Patient will require hospitalization at this time due to his acute condition.  Will consult hospitalist team  CRITICAL CARE Performed by: Curtistine ONEIDA Dawn Total critical care time: 55 minutes Critical care time was exclusive of separately billable procedures and treating other patients. Critical care was necessary to treat or prevent imminent or life-threatening deterioration. Critical care was time spent personally by me on the following activities: development of treatment plan with patient and/or surrogate as well as nursing, discussions with consultants, evaluation of patient's response to treatment, examination of patient, obtaining history from patient or surrogate, ordering and performing treatments and  interventions, ordering and review of laboratory studies, ordering and review of radiographic studies, pulse oximetry and re-evaluation of patient's condition.      Final diagnoses:  None    ED Discharge Orders     None          Dawn Curtistine, MD 10/25/23 1655

## 2023-10-25 NOTE — Assessment & Plan Note (Signed)
 Hold vyvanse, metformin and GLP-1

## 2023-10-26 DIAGNOSIS — F1010 Alcohol abuse, uncomplicated: Secondary | ICD-10-CM | POA: Diagnosis not present

## 2023-10-26 DIAGNOSIS — N390 Urinary tract infection, site not specified: Secondary | ICD-10-CM | POA: Diagnosis not present

## 2023-10-26 DIAGNOSIS — A419 Sepsis, unspecified organism: Secondary | ICD-10-CM | POA: Diagnosis not present

## 2023-10-26 DIAGNOSIS — N410 Acute prostatitis: Secondary | ICD-10-CM

## 2023-10-26 LAB — BLOOD CULTURE ID PANEL (REFLEXED) - BCID2

## 2023-10-26 LAB — CBC
HCT: 42.4 % (ref 39.0–52.0)
Hemoglobin: 13.6 g/dL (ref 13.0–17.0)
MCH: 28.3 pg (ref 26.0–34.0)
MCHC: 32.1 g/dL (ref 30.0–36.0)
MCV: 88.3 fL (ref 80.0–100.0)
Platelets: 259 K/uL (ref 150–400)
RBC: 4.8 MIL/uL (ref 4.22–5.81)
RDW: 14.6 % (ref 11.5–15.5)
WBC: 15.9 K/uL — ABNORMAL HIGH (ref 4.0–10.5)
nRBC: 0 % (ref 0.0–0.2)

## 2023-10-26 LAB — BASIC METABOLIC PANEL WITH GFR
Anion gap: 12 (ref 5–15)
BUN: 12 mg/dL (ref 6–20)
CO2: 21 mmol/L — ABNORMAL LOW (ref 22–32)
Calcium: 8.6 mg/dL — ABNORMAL LOW (ref 8.9–10.3)
Chloride: 103 mmol/L (ref 98–111)
Creatinine, Ser: 0.88 mg/dL (ref 0.61–1.24)
GFR, Estimated: >60 mL/min (ref >60)
Glucose, Bld: 67 mg/dL — ABNORMAL LOW (ref 70–99)
Potassium: 3.6 mmol/L (ref 3.5–5.1)
Sodium: 136 mmol/L (ref 135–145)

## 2023-10-26 LAB — RESP PANEL BY RT-PCR (RSV, FLU A&B, COVID)  RVPGX2
Influenza A by PCR: NEGATIVE
Influenza B by PCR: NEGATIVE
Resp Syncytial Virus by PCR: NEGATIVE
SARS Coronavirus 2 by RT PCR: NEGATIVE

## 2023-10-26 LAB — GLUCOSE, CAPILLARY
Glucose-Capillary: 107 mg/dL — ABNORMAL HIGH (ref 70–99)
Glucose-Capillary: 120 mg/dL — ABNORMAL HIGH (ref 70–99)

## 2023-10-26 LAB — PSA: Prostatic Specific Antigen: 49.41 ng/mL — ABNORMAL HIGH (ref 0.00–4.00)

## 2023-10-26 MED ORDER — LORAZEPAM 2 MG/ML IJ SOLN: 1.0000 mg | INTRAMUSCULAR | Status: AC | PRN

## 2023-10-26 MED ORDER — SENNOSIDES-DOCUSATE SODIUM 8.6-50 MG PO TABS
2.0000 | ORAL_TABLET | Freq: Two times a day (BID) | ORAL | Status: DC
Start: 2023-10-26 — End: 2023-10-29
  Administered 2023-10-26 – 2023-10-29 (×6): 2 via ORAL
  Filled 2023-10-26 (×6): qty 2

## 2023-10-26 MED ORDER — LACTATED RINGERS IV SOLN: INTRAVENOUS | Status: AC

## 2023-10-26 MED ORDER — LORAZEPAM 1 MG PO TABS: 1.0000 mg | ORAL_TABLET | ORAL | Status: AC | PRN

## 2023-10-26 MED ORDER — ACETAMINOPHEN 500 MG PO TABS
1000.0000 mg | ORAL_TABLET | Freq: Once | ORAL | Status: AC
  Administered 2023-10-26: 1000 mg via ORAL
  Filled 2023-10-26: qty 2

## 2023-10-26 MED ORDER — POLYETHYLENE GLYCOL 3350 17 G PO PACK
17.0000 g | PACK | Freq: Every day | ORAL | Status: DC
  Administered 2023-10-26 – 2023-10-27 (×2): 17 g via ORAL
  Filled 2023-10-26 (×3): qty 1

## 2023-10-26 MED ORDER — IBUPROFEN 800 MG PO TABS
800.0000 mg | ORAL_TABLET | Freq: Once | ORAL | Status: AC
  Administered 2023-10-26: 800 mg via ORAL
  Filled 2023-10-26: qty 4

## 2023-10-26 MED ORDER — POTASSIUM CHLORIDE CRYS ER 20 MEQ PO TBCR
40.0000 meq | EXTENDED_RELEASE_TABLET | Freq: Once | ORAL | Status: AC
  Administered 2023-10-26: 40 meq via ORAL
  Filled 2023-10-26: qty 2

## 2023-10-26 MED ORDER — SODIUM CHLORIDE 0.9 % IV BOLUS
500.0000 mL | Freq: Once | INTRAVENOUS | Status: AC
  Administered 2023-10-26: 500 mL via INTRAVENOUS

## 2023-10-26 MED ORDER — IBUPROFEN 800 MG PO TABS
800.0000 mg | ORAL_TABLET | Freq: Once | ORAL | Status: AC
  Administered 2023-10-26: 800 mg via ORAL
  Filled 2023-10-26: qty 1
  Filled 2023-10-26: qty 4

## 2023-10-26 NOTE — Progress Notes (Signed)
   10/26/23 1426  Assess: MEWS Score  Temp (!) 102.6 F (39.2 C)  ECG Heart Rate (!) 130  Resp (!) 23  Assess: MEWS Score  MEWS Temp 2  MEWS Systolic 0  MEWS Pulse 3  MEWS RR 1  MEWS LOC 0  MEWS Score 6  MEWS Score Color Red  Assess: if the MEWS score is Yellow or Red  Were vital signs accurate and taken at a resting state? Yes  Does the patient meet 2 or more of the SIRS criteria? Yes  MEWS guidelines implemented  Yes, red  Treat  MEWS Interventions Considered administering scheduled or prn medications/treatments as ordered  Take Vital Signs  Increase Vital Sign Frequency  Red: Q1hr x2, continue Q4hrs until patient remains green for 12hrs  Escalate  MEWS: Escalate Red: Discuss with charge nurse and notify provider. Consider notifying RRT. If remains red for 2 hours consider need for higher level of care  Notify: Charge Nurse/RN  Name of Charge Nurse/RN Notified Abby RN  Provider Notification  Provider Name/Title VERDENE, MD  Date Provider Notified 10/26/23  Time Provider Notified 1430  Method of Notification Page  Notification Reason Critical Result  Provider response En route;See new orders  Date of Provider Response 10/26/23  Time of Provider Response 1432  Assess: SIRS CRITERIA  SIRS Temperature  1  SIRS Respirations  1  SIRS Pulse 1  SIRS WBC 0  SIRS Score Sum  3

## 2023-10-26 NOTE — Progress Notes (Signed)
 PHARMACY - PHYSICIAN COMMUNICATION CRITICAL VALUE ALERT - BLOOD CULTURE IDENTIFICATION (BCID)  Trevor Brewer is an 33 y.o. male who presented to [facility] on 10/25/2023 with a chief complaint of UTI  Assessment:  1/4 bottles growing enterobacterales nothing specific   Name of physician (or Provider) Contacted: Verdene  Current antibiotics: Zosyn   Changes to prescribed antibiotics recommended:  NO changes  Results for orders placed or performed during the hospital encounter of 10/25/23  Blood Culture ID Panel (Reflexed) (Collected: 10/25/2023  3:45 PM)  Result Value Ref Range   Enterococcus faecalis NOT DETECTED NOT DETECTED   Enterococcus Faecium NOT DETECTED NOT DETECTED   Listeria monocytogenes NOT DETECTED NOT DETECTED   Staphylococcus species NOT DETECTED NOT DETECTED   Staphylococcus aureus (BCID) NOT DETECTED NOT DETECTED   Staphylococcus epidermidis NOT DETECTED NOT DETECTED   Staphylococcus lugdunensis NOT DETECTED NOT DETECTED   Streptococcus species NOT DETECTED NOT DETECTED   Streptococcus agalactiae NOT DETECTED NOT DETECTED   Streptococcus pneumoniae NOT DETECTED NOT DETECTED   Streptococcus pyogenes NOT DETECTED NOT DETECTED   A.calcoaceticus-baumannii NOT DETECTED NOT DETECTED   Bacteroides fragilis NOT DETECTED NOT DETECTED   Enterobacterales DETECTED (A) NOT DETECTED   Enterobacter cloacae complex NOT DETECTED NOT DETECTED   Escherichia coli NOT DETECTED NOT DETECTED   Klebsiella aerogenes NOT DETECTED NOT DETECTED   Klebsiella oxytoca NOT DETECTED NOT DETECTED   Klebsiella pneumoniae NOT DETECTED NOT DETECTED   Proteus species NOT DETECTED NOT DETECTED   Salmonella species NOT DETECTED NOT DETECTED   Serratia marcescens NOT DETECTED NOT DETECTED   Haemophilus influenzae NOT DETECTED NOT DETECTED   Neisseria meningitidis NOT DETECTED NOT DETECTED   Pseudomonas aeruginosa NOT DETECTED NOT DETECTED   Stenotrophomonas maltophilia NOT DETECTED NOT DETECTED    Candida albicans NOT DETECTED NOT DETECTED   Candida auris NOT DETECTED NOT DETECTED   Candida glabrata NOT DETECTED NOT DETECTED   Candida krusei NOT DETECTED NOT DETECTED   Candida parapsilosis NOT DETECTED NOT DETECTED   Candida tropicalis NOT DETECTED NOT DETECTED   Cryptococcus neoformans/gattii NOT DETECTED NOT DETECTED   CTX-M ESBL NOT DETECTED NOT DETECTED   Carbapenem resistance IMP NOT DETECTED NOT DETECTED   Carbapenem resistance KPC NOT DETECTED NOT DETECTED   Carbapenem resistance NDM NOT DETECTED NOT DETECTED   Carbapenem resist OXA 48 LIKE NOT DETECTED NOT DETECTED   Carbapenem resistance VIM NOT DETECTED NOT DETECTED    Trevor Brewer 10/26/2023  9:14 AM

## 2023-10-26 NOTE — Progress Notes (Signed)
 [facility] PROGRESS NOTE   Trevor Brewer FMW:[id number] DOB: 19-Feb-1990 DOA: 10/25/2023  PCP: Nikki Rams, Aliene, MD  Brief History: 33 y.o. male with medical history significant of GERD, alcohol  use, bipolar 2 and GAD who presented to ED with fever and worsening symptoms after being seen in urgent care yesterday and being treated for a UTI with macrobid.  Patient was evaluated in the ED.  CT scan of the abdomen pelvis raised concern for prostatitis.  No abscess was noted.  Patient was noted to be febrile.  He was hospitalized for further management.    Consultants: None  Procedures: None    Subjective/Interval History: Patient complains of feeling weak but better than yesterday.  Denies any nausea vomiting.  Some discomfort in the lower abdomen.  Denies any diarrhea.    Assessment/Plan:  Acute prostatitis/acute UTI/sepsis, present on admission No abscess noted on CT scan.  Nonobstructing stones were noted in the right kidney. UA noted to be abnormal.  Follow-up on cultures.  WBC remains elevated.  Lactic acid level was reassuringly normal.   Elevated PSA most likely due to acute infection.  This will need to be rechecked in a few weeks.  This was communicated to the patient. Patient noted to be on Zosyn  currently.  Continue with this medication for now.  Will adjust treatment based on culture data as well as clinical improvement.  Looks like he was recently treated with Macrobid. Continue with IV fluids.  History of alcohol  abuse Drinks about 3-4 times a week.  Drinks about 4-5 shots of tequila every time he drinks.  Continue with multivitamin and thiamine 's.  Will initiate as needed CIWA protocol.  Bipolar disorder Apparently off of all his medications currently.  Outpatient follow-up.  Hypoglycemia Noted to have low glucose levels this morning.  Will order CBGs.  Nursing staff notified.  Hyponatremia Likely due to hypovolemia.  Improved with IV fluids.  DVT  Prophylaxis: Lovenox  Code Status: Full code Family Communication: Discussed with patient Disposition Plan: Mobilize.  Anticipate discharge home in improved  Status is: Inpatient Remains inpatient appropriate because: Need for IV antibiotics, failed outpatient treatment      Medications: Scheduled:  enoxaparin  (LOVENOX ) injection  40 mg Subcutaneous Q24H   folic acid   1 mg Oral Daily   multivitamin with minerals  1 tablet Oral Daily   nicotine   7 mg Transdermal Daily   pantoprazole   80 mg Oral Daily   thiamine   100 mg Oral Daily   Continuous:  lactated ringers  150 mL/hr at 10/26/23 0435   piperacillin -tazobactam (ZOSYN )  IV 3.375 g (10/26/23 9386)   PRN:acetaminophen  **OR** acetaminophen , baclofen , HYDROcodone -acetaminophen , ondansetron  **OR** ondansetron  (ZOFRAN ) IV, traZODone   Antibiotics: Anti-infectives (From admission, onward)    Start     Dose/Rate Route Frequency Ordered Stop   10/25/23 1800  piperacillin -tazobactam (ZOSYN ) IVPB 3.375 g        3.375 g 12.5 mL/hr over 240 Minutes Intravenous Every 8 hours 10/25/23 1738     10/25/23 1545  cefTRIAXone  (ROCEPHIN ) 2 g in sodium chloride  0.9 % 100 mL IVPB        2 g 200 mL/hr over 30 Minutes Intravenous Once 10/25/23 1543 10/25/23 1803       Objective:  Vital Signs  Vitals:   10/25/23 2300 10/25/23 2340 10/26/23 0411 10/26/23 0746  BP:  109/78 109/67 117/68  Pulse:  (!) 122 (!) 123 (!) 118  Resp:  20 20   Temp: 99.9 F (37.7 C) 98.5 F (36.9 C) (!)  100.9 F (38.3 C) (!) 101.6 F (38.7 C)  TempSrc: Oral Oral Oral Oral  SpO2:  100% 96% 100%  Weight:      Height:        Intake/Output Summary (Last 24 hours) at 10/26/2023 0842 Last data filed at 10/26/2023 9386 Gross per 24 hour  Intake 2362.03 ml  Output --  Net 2362.03 ml   Filed Weights   10/25/23 1546 10/25/23 2100  Weight: 95.3 kg 97.8 kg    General appearance: Awake alert.  In no distress Resp: Clear to auscultation bilaterally.  Normal  effort Cardio: S1-S2 is normal regular.  No S3-S4.  No rubs murmurs or bruit GI: Abdomen is soft.  Tender in the suprapubic area without any rebound rigidity or guarding.  No masses organomegaly.  Bowel sounds present normal. Extremities: No edema.  Full range of motion of lower extremities. Neurologic: Alert and oriented x3.  No focal neurological deficits.    Lab Results:  Data Reviewed: I have personally reviewed following labs and reports of the imaging studies  CBC: Recent Labs  Lab 10/25/23 1545 10/25/23 1550 10/25/23 2351  WBC 14.9*  --  15.9*  NEUTROABS 14.1*  --   --   HGB 16.0 17.3* 13.6  HCT 50.0 51.0 42.4  MCV 85.9  --  88.3  PLT 330  --  259    Basic Metabolic Panel: Recent Labs  Lab 10/25/23 1541 10/25/23 1545 10/25/23 1550 10/25/23 2351  NA  --  133* 134* 136  K  --  4.3 4.1 3.6  CL  --  97* 99 103  CO2  --  21*  --  21*  GLUCOSE  --  86 87 67*  BUN  --  12 12 12   CREATININE  --  0.90 0.90 0.88  CALCIUM  --  9.4  --  8.6*  MG 2.0  --   --   --     GFR: Estimated Creatinine Clearance: 140 mL/min (by C-G formula based on SCr of 0.88 mg/dL).  Liver Function Tests: Recent Labs  Lab 10/25/23 1545  AST 24  ALT 22  ALKPHOS 89  BILITOT 1.6*  PROT 8.0  ALBUMIN 4.5    Coagulation Profile: Recent Labs  Lab 10/25/23 1545  INR 1.0    CBG: Recent Labs  Lab 10/26/23 0730  GLUCAP 107*    Thyroid Function Tests: Recent Labs    10/25/23 1541  TSH 0.965    Recent Results (from the past 240 hours)  Culture, blood (Routine x 2)     Status: None (Preliminary result)   Collection Time: 10/25/23  3:45 PM   Specimen: BLOOD  Result Value Ref Range Status   Specimen Description   Final    BLOOD BLOOD RIGHT ARM Performed at [facility], [address], [place], [place] [address]    Special Requests   Final    BOTTLES DRAWN AEROBIC AND ANAEROBIC Blood Culture adequate volume Performed at [facility],  [address] [place], [place] [address]    Culture  Setup Time   Final    GRAM NEGATIVE RODS ANAEROBIC BOTTLE ONLY Performed at [facility], [address] [place], [place] [address]    Culture GRAM NEGATIVE RODS  Final   Report Status PENDING  Incomplete  Culture, blood (Routine x 2)     Status: None (Preliminary result)   Collection Time: 10/25/23  3:45 PM   Specimen: BLOOD  Result Value Ref  Range Status   Specimen Description   Final    BLOOD BLOOD LEFT ARM Performed at [facility], [address], [place], [place] [address]    Special Requests   Final    BOTTLES DRAWN AEROBIC AND ANAEROBIC Blood Culture adequate volume Performed at [facility], [address], [place], [place] [address]    Culture  Setup Time   Final    GRAM NEGATIVE RODS ANAEROBIC BOTTLE ONLY Organism ID to follow CRITICAL RESULT CALLED TO, READ BACK BY AND VERIFIED WITH: Performed at [facility], [address] [place], [place] [address]    Culture GRAM NEGATIVE RODS  Final   Report Status PENDING  Incomplete  Resp panel by RT-PCR (RSV, Flu A&B, Covid) Anterior Nasal Swab     Status: None   Collection Time: 10/25/23  3:47 PM   Specimen: Anterior Nasal Swab  Result Value Ref Range Status   SARS Coronavirus 2 by RT PCR NEGATIVE NEGATIVE Final   Influenza A by PCR NEGATIVE NEGATIVE Final   Influenza B by PCR NEGATIVE NEGATIVE Final   Resp Syncytial Virus by PCR NEGATIVE NEGATIVE Final    Comment: Performed at [facility], [address], [place], [place] [address]      Radiology Studies: CT Renal Stone Study Result Date: 10/25/2023 CLINICAL DATA:  Abdominal/flank pain. Possible renal stone. Being treated for UTI. EXAM: CT ABDOMEN AND PELVIS WITHOUT CONTRAST TECHNIQUE: Multidetector CT imaging of the abdomen and pelvis was performed following the standard protocol without IV contrast. RADIATION DOSE REDUCTION: This  exam was performed according to the departmental dose-optimization program which includes automated exposure control, adjustment of the mA and/or kV according to patient size and/or use of iterative reconstruction technique. COMPARISON:  None Available. FINDINGS: Lower chest: Heart size is normal.  Lung bases are clear. Hepatobiliary: Liver, gallbladder and biliary tree are normal. Pancreas: Normal. Spleen: Normal. Adrenals/Urinary Tract: Adrenal glands are normal. Kidneys are normal in size. Couple punctate nonobstructing stones over the lower pole right kidney. No left-sided renal stones. No hydronephrosis. Ureters and bladder are unremarkable. Stomach/Bowel: Stomach and small bowel are normal. Appendix is normal. Diverticulosis of the descending colon. Vascular/Lymphatic: Abdominal aorta is normal in caliber. Remaining vascular structures are unremarkable. No adenopathy. Reproductive: Mild prominence of the prostate gland with slightly ill-defined margins and slight hazy appearance of the adjacent fat. These findings could be seen in acute prostatitis. Other: No free peritoneal fluid. Musculoskeletal: No focal abnormality. IMPRESSION: 1. No acute findings in the abdomen. 2. Mild prominence of the prostate gland with ill-defined margins and hazy appearance of the adjacent fat. These findings could be seen in acute prostatitis. 3. Couple punctate nonobstructing stones over the lower pole right kidney. 4. Diverticulosis of the descending colon. Electronically Signed   By: Toribio Agreste M.D.   On: 10/25/2023 16:44   DG Chest Port 1 View Result Date: 10/25/2023 CLINICAL DATA:  Questionable sepsis - evaluate for abnormality EXAM: PORTABLE CHEST - 1 VIEW COMPARISON:  Jun 19, 2012 FINDINGS: Low lung volumes no focal airspace consolidation, pleural effusion, or pneumothorax. No cardiomegaly.No acute fracture or destructive lesion. IMPRESSION: Low lung volumes.  Otherwise, no acute cardiopulmonary abnormality.  Electronically Signed   By: Rogelia Myers M.D.   On: 10/25/2023 16:38       LOS: 1 day   Joette Pebbles  [facility] Pager on [email or web address]  10/26/2023, 8:42 AM

## 2023-10-27 ENCOUNTER — Encounter (HOSPITAL_COMMUNITY): Payer: Self-pay | Admitting: Family Medicine

## 2023-10-27 DIAGNOSIS — N390 Urinary tract infection, site not specified: Secondary | ICD-10-CM | POA: Diagnosis not present

## 2023-10-27 DIAGNOSIS — F1010 Alcohol abuse, uncomplicated: Secondary | ICD-10-CM | POA: Diagnosis not present

## 2023-10-27 DIAGNOSIS — A419 Sepsis, unspecified organism: Secondary | ICD-10-CM | POA: Diagnosis not present

## 2023-10-27 DIAGNOSIS — N410 Acute prostatitis: Secondary | ICD-10-CM | POA: Diagnosis not present

## 2023-10-27 LAB — GLUCOSE, CAPILLARY
Glucose-Capillary: 99 mg/dL (ref 70–99)
Glucose-Capillary: 86 mg/dL (ref 70–99)

## 2023-10-27 LAB — BASIC METABOLIC PANEL WITH GFR
Anion gap: 9 (ref 5–15)
BUN: 6 mg/dL (ref 6–20)
CO2: 19 mmol/L — ABNORMAL LOW (ref 22–32)
Calcium: 8.6 mg/dL — ABNORMAL LOW (ref 8.9–10.3)
Chloride: 105 mmol/L (ref 98–111)
Creatinine, Ser: 0.72 mg/dL (ref 0.61–1.24)
GFR, Estimated: >60 mL/min (ref >60)
Glucose, Bld: 107 mg/dL — ABNORMAL HIGH (ref 70–99)
Potassium: 3.7 mmol/L (ref 3.5–5.1)
Sodium: 133 mmol/L — ABNORMAL LOW (ref 135–145)

## 2023-10-27 LAB — CBC
HCT: 40.8 % (ref 39.0–52.0)
Hemoglobin: 13.2 g/dL (ref 13.0–17.0)
MCH: 28.3 pg (ref 26.0–34.0)
MCHC: 32.4 g/dL (ref 30.0–36.0)
MCV: 87.6 fL (ref 80.0–100.0)
Platelets: 205 K/uL (ref 150–400)
RBC: 4.66 MIL/uL (ref 4.22–5.81)
RDW: 14.8 % (ref 11.5–15.5)
WBC: 15.5 K/uL — ABNORMAL HIGH (ref 4.0–10.5)
nRBC: 0 % (ref 0.0–0.2)

## 2023-10-27 LAB — URINE CULTURE: Culture: NO GROWTH

## 2023-10-27 LAB — PROCALCITONIN: Procalcitonin: 3.94 ng/mL

## 2023-10-27 MED ORDER — LACTATED RINGERS IV SOLN: INTRAVENOUS | Status: DC

## 2023-10-27 MED ORDER — IBUPROFEN 800 MG PO TABS
800.0000 mg | ORAL_TABLET | Freq: Once | ORAL | Status: AC
  Administered 2023-10-27: 800 mg via ORAL
  Filled 2023-10-27: qty 4

## 2023-10-27 MED ORDER — IBUPROFEN 200 MG PO TABS: 400.0000 mg | ORAL_TABLET | ORAL | Status: DC | PRN

## 2023-10-27 MED ORDER — ACETAMINOPHEN 325 MG PO TABS: 650.0000 mg | ORAL_TABLET | Freq: Four times a day (QID) | ORAL | Status: DC | PRN

## 2023-10-27 MED ORDER — LISDEXAMFETAMINE DIMESYLATE 30 MG PO CAPS: 60.0000 mg | ORAL_CAPSULE | Freq: Every morning | ORAL | Status: DC

## 2023-10-27 NOTE — Progress Notes (Signed)
 [facility] PROGRESS NOTE   Trevor Brewer FMW:[id number] DOB: 24-Dec-1990 DOA: 10/25/2023  PCP: Nikki Rams, Aliene, MD  Brief History: 33 y.o. male with medical history significant of GERD, alcohol  use, bipolar 2 and GAD who presented to ED with fever and worsening symptoms after being seen in urgent care yesterday and being treated for a UTI with macrobid.  Patient was evaluated in the ED.  CT scan of the abdomen pelvis raised concern for prostatitis.  No abscess was noted.  Patient was noted to be febrile.  He was hospitalized for further management.    Consultants: None  Procedures: None   Subjective/Interval History: Patient feels a little better this morning.  Does mentions that yesterday was a rough day because of the fevers.  Denies any chest pain shortness of breath nausea vomiting.  No abdominal pain.  Feels like he will have a bowel movement today.  Denies any dysuria.    Assessment/Plan:  Acute prostatitis/acute UTI/sepsis, present on admission/gram-negative bacteremia No abscess noted on CT scan.  Nonobstructing stones were noted in the right kidney. UA noted to be abnormal.  Follow-up on cultures.  WBC remains elevated.  Lactic acid level was reassuringly normal.   Elevated PSA most likely due to acute infection.  This will need to be rechecked in a few weeks.  This was communicated to the patient. Gram-negative bacteremia noted.  Await final identification and sensitivities.  Patient had persistent fever over the last 24 hours.  Hopefully fever trends will start improving soon. Continue with Zosyn .  WBC remains elevated.  Procalcitonin is 3.94. Continue with IV fluids.  History of alcohol  abuse Drinks about 3-4 times a week.  Drinks about 4-5 shots of tequila every time he drinks.  Continue with multivitamin and thiamine 's.  Continue CIWA protocol.  No evidence for withdrawal currently.  Bipolar disorder Apparently off of all his medications currently.   Outpatient follow-up.  Hypoglycemia Glucose levels have improved.  Hyponatremia Likely due to hypovolemia.  Continue IV fluids.  DVT Prophylaxis: Lovenox  Code Status: Full code Family Communication: Discussed with patient Disposition Plan: Mobilize.  Anticipate discharge home when improved     Medications: Scheduled:  enoxaparin  (LOVENOX ) injection  40 mg Subcutaneous Q24H   folic acid   1 mg Oral Daily   multivitamin with minerals  1 tablet Oral Daily   nicotine   7 mg Transdermal Daily   pantoprazole   80 mg Oral Daily   polyethylene glycol  17 g Oral Daily   senna-docusate  2 tablet Oral BID   thiamine   100 mg Oral Daily   Continuous:  piperacillin -tazobactam (ZOSYN )  IV 3.375 g (10/27/23 0518)   PRN:acetaminophen  **OR** acetaminophen , baclofen , HYDROcodone -acetaminophen , LORazepam  **OR** LORazepam , ondansetron  **OR** ondansetron  (ZOFRAN ) IV, traZODone   Antibiotics: Anti-infectives (From admission, onward)    Start     Dose/Rate Route Frequency Ordered Stop   10/25/23 1800  piperacillin -tazobactam (ZOSYN ) IVPB 3.375 g        3.375 g 12.5 mL/hr over 240 Minutes Intravenous Every 8 hours 10/25/23 1738     10/25/23 1545  cefTRIAXone  (ROCEPHIN ) 2 g in sodium chloride  0.9 % 100 mL IVPB        2 g 200 mL/hr over 30 Minutes Intravenous Once 10/25/23 1543 10/25/23 1803       Objective:  Vital Signs  Vitals:   10/26/23 2259 10/27/23 0509 10/27/23 0600 10/27/23 0922  BP: 102/62 116/65  115/75  Pulse: 98 (!) 106  86  Resp: (!) 24 20 18  20  Temp: 98.8 F (37.1 C) (!) 101 F (38.3 C)  99.4 F (37.4 C)  TempSrc: Oral Oral  Oral  SpO2: 98% 97%  99%  Weight:      Height:        Intake/Output Summary (Last 24 hours) at 10/27/2023 1002 Last data filed at 10/27/2023 0519 Gross per 24 hour  Intake 1732.77 ml  Output 1525 ml  Net 207.77 ml   Filed Weights   10/25/23 1546 10/25/23 2100  Weight: 95.3 kg 97.8 kg    General appearance: Awake alert.  In no  distress Resp: Clear to auscultation bilaterally.  Normal effort Cardio: S1-S2 is normal regular.  No S3-S4.  No rubs murmurs or bruit GI: Abdomen is soft.  Nontender nondistended.  Bowel sounds are present normal.  No masses organomegaly Extremities: No edema.  Full range of motion of lower extremities. Neurologic: Alert and oriented x3.  No focal neurological deficits.     Lab Results:  Data Reviewed: I have personally reviewed following labs and reports of the imaging studies  CBC: Recent Labs  Lab 10/25/23 1545 10/25/23 1550 10/25/23 2351 10/27/23 0400  WBC 14.9*  --  15.9* 15.5*  NEUTROABS 14.1*  --   --   --   HGB 16.0 17.3* 13.6 13.2  HCT 50.0 51.0 42.4 40.8  MCV 85.9  --  88.3 87.6  PLT 330  --  259 205    Basic Metabolic Panel: Recent Labs  Lab 10/25/23 1541 10/25/23 1545 10/25/23 1550 10/25/23 2351 10/27/23 0400  NA  --  133* 134* 136 133*  K  --  4.3 4.1 3.6 3.7  CL  --  97* 99 103 105  CO2  --  21*  --  21* 19*  GLUCOSE  --  86 87 67* 107*  BUN  --  12 12 12 6   CREATININE  --  0.90 0.90 0.88 0.72  CALCIUM  --  9.4  --  8.6* 8.6*  MG 2.0  --   --   --   --     GFR: Estimated Creatinine Clearance: 154 mL/min (by C-G formula based on SCr of 0.72 mg/dL).  Liver Function Tests: Recent Labs  Lab 10/25/23 1545  AST 24  ALT 22  ALKPHOS 89  BILITOT 1.6*  PROT 8.0  ALBUMIN 4.5    Coagulation Profile: Recent Labs  Lab 10/25/23 1545  INR 1.0    CBG: Recent Labs  Lab 10/26/23 0730 10/26/23 1655 10/27/23 0801  GLUCAP 107* 120* 99    Thyroid Function Tests: Recent Labs    10/25/23 1541  TSH 0.965    Recent Results (from the past 240 hours)  Urine Culture     Status: None   Collection Time: 10/25/23  3:35 PM   Specimen: Urine, Random  Result Value Ref Range Status   Specimen Description   Final    URINE, RANDOM Performed at [facility], [address], [place], [place] [address]    Special Requests   Final     NONE Reflexed from [id number] Performed at [facility], [address] [place], [place] [address]    Culture   Final    NO GROWTH Performed at [facility], [address] [place], [place] [address]    Report Status 10/27/2023 FINAL  Final  Culture, blood (Routine x 2)     Status: None (Preliminary result)   Collection Time: 10/25/23  3:45 PM   Specimen:  BLOOD RIGHT ARM  Result Value Ref Range Status   Specimen Description   Final    BLOOD RIGHT ARM Performed at [facility], [address], [place], [place] [address]    Special Requests   Final    BOTTLES DRAWN AEROBIC AND ANAEROBIC Blood Culture adequate volume Performed at [facility], [address] [place], [place] [address]    Culture  Setup Time   Final    GRAM NEGATIVE RODS IN BOTH AEROBIC AND ANAEROBIC BOTTLES CRITICAL VALUE NOTED.  VALUE IS CONSISTENT WITH PREVIOUSLY REPORTED AND CALLED VALUE. Performed at [facility], [address] [place], [place] [address]    Culture GRAM NEGATIVE RODS  Final   Report Status PENDING  Incomplete  Culture, blood (Routine x 2)     Status: None (Preliminary result)   Collection Time: 10/25/23  3:45 PM   Specimen: BLOOD LEFT ARM  Result Value Ref Range Status   Specimen Description   Final    BLOOD LEFT ARM Performed at [facility], [address], [place], [place] [address]    Special Requests   Final    BOTTLES DRAWN AEROBIC AND ANAEROBIC Blood Culture adequate volume Performed at [facility], [address], [place], [place] [address]    Culture  Setup Time   Final    GRAM NEGATIVE RODS IN BOTH AEROBIC AND ANAEROBIC BOTTLES CRITICAL RESULT CALLED TO, READ BACK BY AND VERIFIED WITH: JUSTIN LEGGE AT 0910 10/26/23 RAM    Culture   Final    GRAM NEGATIVE RODS IDENTIFICATION AND SUSCEPTIBILITIES TO FOLLOW Performed at [facility], [address], [place], [place] [address]     Report Status PENDING  Incomplete  Blood Culture ID Panel (Reflexed)     Status: Abnormal   Collection Time: 10/25/23  3:45 PM  Result Value Ref Range Status   Enterococcus faecalis NOT DETECTED NOT DETECTED Final   Enterococcus Faecium NOT DETECTED NOT DETECTED Final   Listeria monocytogenes NOT DETECTED NOT DETECTED Final   Staphylococcus species NOT DETECTED NOT DETECTED Final   Staphylococcus aureus (BCID) NOT DETECTED NOT DETECTED Final   Staphylococcus epidermidis NOT DETECTED NOT DETECTED Final   Staphylococcus lugdunensis NOT DETECTED NOT DETECTED Final   Streptococcus species NOT DETECTED NOT DETECTED Final   Streptococcus agalactiae NOT DETECTED NOT DETECTED Final   Streptococcus pneumoniae NOT DETECTED NOT DETECTED Final   Streptococcus pyogenes NOT DETECTED NOT DETECTED Final   A.calcoaceticus-baumannii NOT DETECTED NOT DETECTED Final   Bacteroides fragilis NOT DETECTED NOT DETECTED Final   Enterobacterales DETECTED (A) NOT DETECTED Final    Comment: Enterobacterales represent a large order of gram negative bacteria, not a single organism. Refer to culture for further identification. CRITICAL RESULT CALLED TO, READ BACK BY AND VERIFIED WITH: JUSTIN LEGGE AT 0909 10/26/23 RAM    Enterobacter cloacae complex NOT DETECTED NOT DETECTED Final   Escherichia coli NOT DETECTED NOT DETECTED Final   Klebsiella aerogenes NOT DETECTED NOT DETECTED Final   Klebsiella oxytoca NOT DETECTED NOT DETECTED Final   Klebsiella pneumoniae NOT DETECTED NOT DETECTED Final   Proteus species NOT DETECTED NOT DETECTED Final   Salmonella species NOT DETECTED NOT DETECTED Final   Serratia marcescens NOT DETECTED NOT DETECTED Final   Haemophilus influenzae NOT DETECTED NOT DETECTED Final   Neisseria meningitidis NOT DETECTED NOT DETECTED Final   Pseudomonas aeruginosa NOT DETECTED NOT DETECTED Final   Stenotrophomonas maltophilia NOT DETECTED NOT  DETECTED Final   Candida albicans NOT DETECTED NOT  DETECTED Final   Candida auris NOT DETECTED NOT DETECTED Final   Candida glabrata NOT DETECTED NOT DETECTED Final   Candida krusei NOT DETECTED NOT DETECTED Final   Candida parapsilosis NOT DETECTED NOT DETECTED Final   Candida tropicalis NOT DETECTED NOT DETECTED Final   Cryptococcus neoformans/gattii NOT DETECTED NOT DETECTED Final   CTX-M ESBL NOT DETECTED NOT DETECTED Final   Carbapenem resistance IMP NOT DETECTED NOT DETECTED Final   Carbapenem resistance KPC NOT DETECTED NOT DETECTED Final   Carbapenem resistance NDM NOT DETECTED NOT DETECTED Final   Carbapenem resist OXA 48 LIKE NOT DETECTED NOT DETECTED Final   Carbapenem resistance VIM NOT DETECTED NOT DETECTED Final    Comment: Performed at [facility], [address] [place], [place] [address]  Resp panel by RT-PCR (RSV, Flu A&B, Covid) Anterior Nasal Swab     Status: None   Collection Time: 10/25/23  3:47 PM   Specimen: Anterior Nasal Swab  Result Value Ref Range Status   SARS Coronavirus 2 by RT PCR NEGATIVE NEGATIVE Final   Influenza A by PCR NEGATIVE NEGATIVE Final   Influenza B by PCR NEGATIVE NEGATIVE Final   Resp Syncytial Virus by PCR NEGATIVE NEGATIVE Final    Comment: Performed at [facility], [address] [place], [place] [address]      Radiology Studies: CT Renal Stone Study Result Date: 10/25/2023 CLINICAL DATA:  Abdominal/flank pain. Possible renal stone. Being treated for UTI. EXAM: CT ABDOMEN AND PELVIS WITHOUT CONTRAST TECHNIQUE: Multidetector CT imaging of the abdomen and pelvis was performed following the standard protocol without IV contrast. RADIATION DOSE REDUCTION: This exam was performed according to the departmental dose-optimization program which includes automated exposure control, adjustment of the mA and/or kV according to patient size and/or use of iterative reconstruction technique. COMPARISON:  None Available. FINDINGS: Lower chest: Heart size is normal.  Lung  bases are clear. Hepatobiliary: Liver, gallbladder and biliary tree are normal. Pancreas: Normal. Spleen: Normal. Adrenals/Urinary Tract: Adrenal glands are normal. Kidneys are normal in size. Couple punctate nonobstructing stones over the lower pole right kidney. No left-sided renal stones. No hydronephrosis. Ureters and bladder are unremarkable. Stomach/Bowel: Stomach and small bowel are normal. Appendix is normal. Diverticulosis of the descending colon. Vascular/Lymphatic: Abdominal aorta is normal in caliber. Remaining vascular structures are unremarkable. No adenopathy. Reproductive: Mild prominence of the prostate gland with slightly ill-defined margins and slight hazy appearance of the adjacent fat. These findings could be seen in acute prostatitis. Other: No free peritoneal fluid. Musculoskeletal: No focal abnormality. IMPRESSION: 1. No acute findings in the abdomen. 2. Mild prominence of the prostate gland with ill-defined margins and hazy appearance of the adjacent fat. These findings could be seen in acute prostatitis. 3. Couple punctate nonobstructing stones over the lower pole right kidney. 4. Diverticulosis of the descending colon. Electronically Signed   By: Toribio Agreste M.D.   On: 10/25/2023 16:44   DG Chest Port 1 View Result Date: 10/25/2023 CLINICAL DATA:  Questionable sepsis - evaluate for abnormality EXAM: PORTABLE CHEST - 1 VIEW COMPARISON:  Jun 19, 2012 FINDINGS: Low lung volumes no focal airspace consolidation, pleural effusion, or pneumothorax. No cardiomegaly.No acute fracture or destructive lesion. IMPRESSION: Low lung volumes.  Otherwise, no acute cardiopulmonary abnormality. Electronically Signed   By: Rogelia Myers M.D.   On: 10/25/2023 16:38       LOS: 2 days   Eljay Lave  [facility] Pager on [email or web address]  10/27/2023, 10:02 AM

## 2023-10-27 NOTE — Progress Notes (Signed)
   10/27/23 1707  Assess: MEWS Score  Temp (!) 101.6 F (38.7 C)  BP 133/83  MAP (mmHg) 98  Pulse Rate (!) 121  Resp 16  SpO2 98 %  O2 Device Room Air  Assess: MEWS Score  MEWS Temp 2  MEWS Systolic 0  MEWS Pulse 2  MEWS RR 0  MEWS LOC 0  MEWS Score 4  MEWS Score Color Red  Assess: if the MEWS score is Yellow or Red  Were vital signs accurate and taken at a resting state? Yes  Does the patient meet 2 or more of the SIRS criteria? Yes  MEWS guidelines implemented  Yes, red  Treat  MEWS Interventions Considered administering scheduled or prn medications/treatments as ordered  Take Vital Signs  Increase Vital Sign Frequency  Red: Q1hr x2, continue Q4hrs until patient remains green for 12hrs  Escalate  MEWS: Escalate Red: Discuss with charge nurse and notify provider. Consider notifying RRT. If remains red for 2 hours consider need for higher level of care  Provider Notification  Provider Name/Title Verdene MD  Date Provider Notified 10/27/23  Time Provider Notified 1710  Method of Notification Page  Notification Reason Critical Result  Provider response See new orders  Date of Provider Response 10/27/23  Time of Provider Response 1712  Assess: SIRS CRITERIA  SIRS Temperature  1  SIRS Respirations  0  SIRS Pulse 1  SIRS WBC 0  SIRS Score Sum  2

## 2023-10-28 DIAGNOSIS — A419 Sepsis, unspecified organism: Secondary | ICD-10-CM | POA: Diagnosis not present

## 2023-10-28 DIAGNOSIS — N390 Urinary tract infection, site not specified: Secondary | ICD-10-CM | POA: Diagnosis not present

## 2023-10-28 DIAGNOSIS — F1010 Alcohol abuse, uncomplicated: Secondary | ICD-10-CM | POA: Diagnosis not present

## 2023-10-28 DIAGNOSIS — N410 Acute prostatitis: Secondary | ICD-10-CM | POA: Diagnosis not present

## 2023-10-28 LAB — CULTURE, BLOOD (ROUTINE X 2)
Special Requests: ADEQUATE
Special Requests: ADEQUATE

## 2023-10-28 LAB — URINE CYTOLOGY ANCILLARY ONLY
Chlamydia: NEGATIVE
Comment: NEGATIVE
Comment: NORMAL
Neisseria Gonorrhea: NEGATIVE

## 2023-10-28 LAB — CBC
HCT: 39.2 % (ref 39.0–52.0)
Hemoglobin: 12.7 g/dL — ABNORMAL LOW (ref 13.0–17.0)
MCH: 28 pg (ref 26.0–34.0)
MCHC: 32.4 g/dL (ref 30.0–36.0)
MCV: 86.5 fL (ref 80.0–100.0)
Platelets: 209 K/uL (ref 150–400)
RBC: 4.53 MIL/uL (ref 4.22–5.81)
RDW: 14.9 % (ref 11.5–15.5)
WBC: 8.9 K/uL (ref 4.0–10.5)
nRBC: 0 % (ref 0.0–0.2)

## 2023-10-28 LAB — BASIC METABOLIC PANEL WITH GFR
Anion gap: 10 (ref 5–15)
BUN: <5 mg/dL — ABNORMAL LOW (ref 6–20)
CO2: 19 mmol/L — ABNORMAL LOW (ref 22–32)
Calcium: 8.5 mg/dL — ABNORMAL LOW (ref 8.9–10.3)
Chloride: 107 mmol/L (ref 98–111)
Creatinine, Ser: 0.76 mg/dL (ref 0.61–1.24)
GFR, Estimated: >60 mL/min (ref >60)
Glucose, Bld: 100 mg/dL — ABNORMAL HIGH (ref 70–99)
Potassium: 3.7 mmol/L (ref 3.5–5.1)
Sodium: 136 mmol/L (ref 135–145)

## 2023-10-28 LAB — GLUCOSE, CAPILLARY: Glucose-Capillary: 123 mg/dL — ABNORMAL HIGH (ref 70–99)

## 2023-10-28 LAB — MAGNESIUM: Magnesium: 2.2 mg/dL (ref 1.7–2.4)

## 2023-10-28 MED ORDER — CIPROFLOXACIN HCL 500 MG PO TABS
500.0000 mg | ORAL_TABLET | Freq: Two times a day (BID) | ORAL | Status: DC
Start: 2023-10-28 — End: 2023-10-29
  Administered 2023-10-28 – 2023-10-29 (×2): 500 mg via ORAL
  Filled 2023-10-28 (×2): qty 1

## 2023-10-28 NOTE — TOC Initial Note (Signed)
 Transition of Care [facility]) - Initial/Assessment Note    Patient Details  Name: Trevor Brewer MRN: [id number] Date of Birth: Jun 13, 1990  [facility] of [facility]) CM/SW Contact:    Duwaine GORMAN Aran, LCSW Phone Number: 10/28/2023, 9:38 AM  Clinical Narrative: Care management consulted for ETOH use resources. CSW met with patient to discuss consult. Patient politely declined resources at this time.  Expected Discharge Plan: Home/Self Care Barriers to Discharge: Continued Medical Work up  Patient Goals and CMS Choice Patient states their goals for this hospitalization and ongoing recovery are:: Home Choice offered to / list presented to : NA  Expected Discharge Plan and Services In-house Referral: Clinical Social Work Post Acute Care Choice: NA Living arrangements for the past 2 months: Apartment           DME Arranged: N/A DME Agency: NA  Prior Living Arrangements/Services Living arrangements for the past 2 months: Apartment Patient language and need for interpreter reviewed:: Yes Do you feel safe going back to the place where you live?: Yes      Need for Family Participation in Patient Care: No (Comment) Care giver support system in place?: Yes (comment) Criminal Activity/Legal Involvement Pertinent to Current Situation/Hospitalization: No - Comment as needed  Activities of Daily Living ADL Screening (condition at time of admission) Independently performs ADLs?: Yes (appropriate for developmental age) Is the patient deaf or have difficulty hearing?: No Does the patient have difficulty seeing, even when wearing glasses/contacts?: No Does the patient have difficulty concentrating, remembering, or making decisions?: No  Emotional Assessment Appearance:: Appears stated age Attitude/Demeanor/Rapport: Engaged Affect (typically observed): Appropriate Orientation: : Oriented to Self, Oriented to Place, Oriented to  Time, Oriented to Situation Alcohol  / Substance Use: Alcohol  Use Psych  Involvement: No (comment)  Admission diagnosis:  Sepsis (HCC) [A41.9] Urinary tract infection with hematuria, site unspecified [N39.0, R31.9] Patient Active Problem List   Diagnosis Date Noted   Sepsis secondary to UTI and prostatitis 10/25/2023   Prostatitis 10/25/2023   Vapes nicotine  containing substance 10/25/2023   Obesity (BMI 30-39.9) 10/25/2023   Bipolar 1 disorder, depressed, severe (HCC) 12/03/2022   history of alcohol  abuse 12/02/2022   GAD (generalized anxiety disorder) 12/02/2022   Bipolar 1 disorder, mixed, moderate (HCC) 12/02/2022   Mixed bipolar II disorder with rapid cycling (HCC) 04/20/2022   Insulin resistance 12/27/2020   Vitamin B12 deficiency 12/27/2020   Vitamin D deficiency 12/27/2020   MRSA infection 04/26/2016   Infection of elbow (HCC) 04/26/2016   No-show for appointment 02/08/2016   Chest pain 06/19/2012   Tachycardia 06/19/2012   Other abnormal glucose 02/22/2012   Routine general medical examination at a health care facility 02/22/2012   GERD (gastroesophageal reflux disease) 02/22/2012   PCP:  Nikki Hansel Atlas, MD Pharmacy:   [facility] - [address], [facility] [place] [address] Phone: [phone] Fax: [phone]  Social Drivers of Health (SDOH) Social History: SDOH Screenings   Food Insecurity: Patient Declined (10/25/2023)  Housing: Unknown (10/25/2023)  Transportation Needs: Patient Declined (10/25/2023)  Utilities: Patient Declined (10/25/2023)  Alcohol  Screen: High Risk (12/02/2022)  Depression (PHQ2-9): High Risk (12/10/2022)  Financial Resource Strain: Low Risk  (03/08/2020)   Received from [facility] visits prior to 03/31/2022.  Physical Activity: Unknown (03/08/2020)   Received from [facility] visits prior to 03/31/2022.  Social Connections: Moderately Isolated (03/08/2020)   Received from [facility] visits  prior  to 03/31/2022.  Stress: Stress Concern Present (03/08/2020)   Received from [facility] visits prior to 03/31/2022.  Tobacco Use: Low Risk  (10/27/2023)  Recent Concern: Tobacco Use - Medium Risk (10/24/2023)   [facility] from [facility]   SDOH Interventions:    Readmission Risk Interventions     No data to display

## 2023-10-28 NOTE — Progress Notes (Signed)
 [facility] PROGRESS NOTE   Trevor Brewer FMW:[id number] DOB: 1990-07-05 DOA: 10/25/2023  PCP: Nikki Rams, Aliene, MD  Brief History: 33 y.o. male with medical history significant of GERD, alcohol  use, bipolar 2 and GAD who presented to ED with fever and worsening symptoms after being seen in urgent care yesterday and being treated for a UTI with macrobid.  Patient was evaluated in the ED.  CT scan of the abdomen pelvis raised concern for prostatitis.  No abscess was noted.  Patient was noted to be febrile.  He was hospitalized for further management.    Consultants: None  Procedures: None   Subjective/Interval History: Patient feels better this morning.  Wanting to know when he can go home.  He was told about his positive culture data.  His fever also needs to resolve prior to discharge.    Assessment/Plan:  Acute prostatitis/acute UTI/sepsis, present on admission/gram-negative bacteremia No abscess noted on CT scan.  Nonobstructing stones were noted in the right kidney. UA noted to be abnormal.  Follow-up on cultures.  WBC remains elevated.  Lactic acid level was reassuringly normal.   Elevated PSA most likely due to acute infection.  This will need to be rechecked in a few weeks.  This was communicated to the patient. Patient with Citrobacter bacteremia.  Sensitivities reviewed.  Leave him on Zosyn  for rest of the day and if he does not have any more fever he could be transition to ciprofloxacin later today.  WBC noted to be better.  IV fluids can be discontinued.  Hopefully fevers have resolved.  Urine cultures without any growth so far.  History of alcohol  abuse Drinks about 3-4 times a week.  Drinks about 4-5 shots of tequila every time he drinks.  Continue with multivitamin and thiamine 's.  Continue CIWA protocol.  No evidence for withdrawal during this hospital stay.  Bipolar disorder Apparently off of all his medications currently.  Outpatient  follow-up.  Hypoglycemia Glucose levels have improved.  Hyponatremia Likely due to hypovolemia.  Resolved with IV fluids.  DVT Prophylaxis: Lovenox  Code Status: Full code Family Communication: Discussed with patient Disposition Plan: Mobilize.  Anticipate discharge home when improved     Medications: Scheduled:  enoxaparin  (LOVENOX ) injection  40 mg Subcutaneous Q24H   folic acid   1 mg Oral Daily   multivitamin with minerals  1 tablet Oral Daily   nicotine   7 mg Transdermal Daily   pantoprazole   80 mg Oral Daily   polyethylene glycol  17 g Oral Daily   senna-docusate  2 tablet Oral BID   thiamine   100 mg Oral Daily   Continuous:  piperacillin -tazobactam (ZOSYN )  IV 3.375 g (10/28/23 0525)   PRN:acetaminophen  **OR** [DISCONTINUED] acetaminophen , baclofen , HYDROcodone -acetaminophen , ibuprofen , LORazepam  **OR** LORazepam , ondansetron  **OR** ondansetron  (ZOFRAN ) IV, traZODone   Antibiotics: Anti-infectives (From admission, onward)    Start     Dose/Rate Route Frequency Ordered Stop   10/25/23 1800  piperacillin -tazobactam (ZOSYN ) IVPB 3.375 g        3.375 g 12.5 mL/hr over 240 Minutes Intravenous Every 8 hours 10/25/23 1738     10/25/23 1545  cefTRIAXone  (ROCEPHIN ) 2 g in sodium chloride  0.9 % 100 mL IVPB        2 g 200 mL/hr over 30 Minutes Intravenous Once 10/25/23 1543 10/25/23 1803       Objective:  Vital Signs  Vitals:   10/27/23 1800 10/27/23 2103 10/28/23 0134 10/28/23 0552  BP: 124/78 (!) 129/90 126/79 (!) 136/90  Pulse:  92 78  77  Resp:  19 19 19   Temp: (!) 101.2 F (38.4 C) 100.1 F (37.8 C) 98.5 F (36.9 C) 98.5 F (36.9 C)  TempSrc:  Oral Oral Oral  SpO2: 97% 98% 99% 100%  Weight:      Height:        Intake/Output Summary (Last 24 hours) at 10/28/2023 1009 Last data filed at 10/28/2023 0300 Gross per 24 hour  Intake 2533.32 ml  Output --  Net 2533.32 ml   Filed Weights   10/25/23 1546 10/25/23 2100  Weight: 95.3 kg 97.8 kg    General  appearance: Awake alert.  In no distress Resp: Clear to auscultation bilaterally.  Normal effort Cardio: S1-S2 is normal regular.  No S3-S4.  No rubs murmurs or bruit GI: Abdomen is soft.  Nontender nondistended.  Bowel sounds are present normal.  No masses organomegaly    Lab Results:  Data Reviewed: I have personally reviewed following labs and reports of the imaging studies  CBC: Recent Labs  Lab 10/25/23 1545 10/25/23 1550 10/25/23 2351 10/27/23 0400 10/28/23 0334  WBC 14.9*  --  15.9* 15.5* 8.9  NEUTROABS 14.1*  --   --   --   --   HGB 16.0 17.3* 13.6 13.2 12.7*  HCT 50.0 51.0 42.4 40.8 39.2  MCV 85.9  --  88.3 87.6 86.5  PLT 330  --  259 205 209    Basic Metabolic Panel: Recent Labs  Lab 10/25/23 1541 10/25/23 1545 10/25/23 1550 10/25/23 2351 10/27/23 0400 10/28/23 0334  NA  --  133* 134* 136 133* 136  K  --  4.3 4.1 3.6 3.7 3.7  CL  --  97* 99 103 105 107  CO2  --  21*  --  21* 19* 19*  GLUCOSE  --  86 87 67* 107* 100*  BUN  --  12 12 12 6  <5*  CREATININE  --  0.90 0.90 0.88 0.72 0.76  CALCIUM  --  9.4  --  8.6* 8.6* 8.5*  MG 2.0  --   --   --   --  2.2    GFR: Estimated Creatinine Clearance: 154 mL/min (by C-G formula based on SCr of 0.76 mg/dL).  Liver Function Tests: Recent Labs  Lab 10/25/23 1545  AST 24  ALT 22  ALKPHOS 89  BILITOT 1.6*  PROT 8.0  ALBUMIN 4.5    Coagulation Profile: Recent Labs  Lab 10/25/23 1545  INR 1.0    CBG: Recent Labs  Lab 10/26/23 0730 10/26/23 1655 10/27/23 0801 10/27/23 1145  GLUCAP 107* 120* 99 86    Thyroid Function Tests: Recent Labs    10/25/23 1541  TSH 0.965    Recent Results (from the past 240 hours)  Urine Culture     Status: None   Collection Time: 10/25/23  3:35 PM   Specimen: Urine, Random  Result Value Ref Range Status   Specimen Description   Final    URINE, RANDOM Performed at [facility], [address], [place], [place] [address]    Special Requests    Final    NONE Reflexed from [id number] Performed at [facility], [address], [place], [place] [address]    Culture   Final    NO GROWTH Performed at [facility], [address] [place], [place] [address]    Report Status 10/27/2023 FINAL  Final  Culture, blood (Routine x 2)     Status: Abnormal  Collection Time: 10/25/23  3:45 PM   Specimen: BLOOD RIGHT ARM  Result Value Ref Range Status   Specimen Description   Final    BLOOD RIGHT ARM Performed at [facility], [address] [place], [place] [address]    Special Requests   Final    BOTTLES DRAWN AEROBIC AND ANAEROBIC Blood Culture adequate volume Performed at [facility], [address], [place], [place] [address]    Culture  Setup Time   Final    GRAM NEGATIVE RODS IN BOTH AEROBIC AND ANAEROBIC BOTTLES CRITICAL VALUE NOTED.  VALUE IS CONSISTENT WITH PREVIOUSLY REPORTED AND CALLED VALUE. Performed at [facility], [address] [place], [place] [address]    Culture (A)  Final    CITROBACTER KOSERI SUSCEPTIBILITIES PERFORMED ON PREVIOUS CULTURE WITHIN THE LAST 5 DAYS. Performed at [facility], [address], [place], [place] [address]    Report Status 10/28/2023 FINAL  Final  Culture, blood (Routine x 2)     Status: Abnormal   Collection Time: 10/25/23  3:45 PM   Specimen: BLOOD LEFT ARM  Result Value Ref Range Status   Specimen Description   Final    BLOOD LEFT ARM Performed at [facility], [address], [place], [place] [address]    Special Requests   Final    BOTTLES DRAWN AEROBIC AND ANAEROBIC Blood Culture adequate volume Performed at [facility], [address], [place], [place] [address]    Culture  Setup Time   Final    GRAM NEGATIVE RODS IN BOTH AEROBIC AND ANAEROBIC BOTTLES CRITICAL RESULT CALLED TO, READ BACK BY AND VERIFIED WITH: JUSTIN LEGGE AT 0910 10/26/23 RAM Performed at [facility], [address], [place], [place] [address]    Culture CITROBACTER KOSERI (A)  Final   Report Status 10/28/2023 FINAL  Final   Organism ID, Bacteria CITROBACTER KOSERI  Final      Susceptibility   Citrobacter koseri - MIC*    CEFEPIME <=0.12 SENSITIVE Sensitive     ERTAPENEM <=0.12 SENSITIVE Sensitive     CEFTRIAXONE  <=0.25 SENSITIVE Sensitive     CIPROFLOXACIN <=0.06 SENSITIVE Sensitive     GENTAMICIN <=1 SENSITIVE Sensitive     MEROPENEM <=0.25 SENSITIVE Sensitive     TRIMETH/SULFA <=20 SENSITIVE Sensitive     PIP/TAZO Value in next row Sensitive      <=4 SENSITIVEThis is a modified FDA-approved test that has been validated and its performance characteristics determined by the reporting laboratory.  This laboratory is certified under the Clinical Laboratory Improvement Amendments CLIA as qualified to perform high complexity clinical laboratory testing.    * CITROBACTER KOSERI  Blood Culture ID Panel (Reflexed)     Status: Abnormal   Collection Time: 10/25/23  3:45 PM  Result Value Ref Range Status   Enterococcus faecalis NOT DETECTED NOT DETECTED Final   Enterococcus Faecium NOT DETECTED NOT DETECTED Final   Listeria monocytogenes NOT DETECTED NOT DETECTED Final   Staphylococcus species NOT DETECTED NOT DETECTED Final   Staphylococcus aureus (BCID) NOT DETECTED NOT DETECTED Final   Staphylococcus epidermidis NOT DETECTED NOT DETECTED Final   Staphylococcus lugdunensis NOT DETECTED NOT DETECTED Final   Streptococcus species NOT DETECTED NOT DETECTED Final   Streptococcus agalactiae NOT DETECTED NOT DETECTED Final   Streptococcus pneumoniae NOT DETECTED NOT DETECTED Final   Streptococcus pyogenes NOT DETECTED NOT DETECTED Final   A.calcoaceticus-baumannii NOT DETECTED NOT DETECTED Final  Bacteroides fragilis NOT DETECTED NOT DETECTED Final   Enterobacterales DETECTED (A) NOT DETECTED Final    Comment: Enterobacterales represent a large order of gram negative bacteria, not a single  organism. Refer to culture for further identification. CRITICAL RESULT CALLED TO, READ BACK BY AND VERIFIED WITH: JUSTIN LEGGE AT 0909 10/26/23 RAM    Enterobacter cloacae complex NOT DETECTED NOT DETECTED Final   Escherichia coli NOT DETECTED NOT DETECTED Final   Klebsiella aerogenes NOT DETECTED NOT DETECTED Final   Klebsiella oxytoca NOT DETECTED NOT DETECTED Final   Klebsiella pneumoniae NOT DETECTED NOT DETECTED Final   Proteus species NOT DETECTED NOT DETECTED Final   Salmonella species NOT DETECTED NOT DETECTED Final   Serratia marcescens NOT DETECTED NOT DETECTED Final   Haemophilus influenzae NOT DETECTED NOT DETECTED Final   Neisseria meningitidis NOT DETECTED NOT DETECTED Final   Pseudomonas aeruginosa NOT DETECTED NOT DETECTED Final   Stenotrophomonas maltophilia NOT DETECTED NOT DETECTED Final   Candida albicans NOT DETECTED NOT DETECTED Final   Candida auris NOT DETECTED NOT DETECTED Final   Candida glabrata NOT DETECTED NOT DETECTED Final   Candida krusei NOT DETECTED NOT DETECTED Final   Candida parapsilosis NOT DETECTED NOT DETECTED Final   Candida tropicalis NOT DETECTED NOT DETECTED Final   Cryptococcus neoformans/gattii NOT DETECTED NOT DETECTED Final   CTX-M ESBL NOT DETECTED NOT DETECTED Final   Carbapenem resistance IMP NOT DETECTED NOT DETECTED Final   Carbapenem resistance KPC NOT DETECTED NOT DETECTED Final   Carbapenem resistance NDM NOT DETECTED NOT DETECTED Final   Carbapenem resist OXA 48 LIKE NOT DETECTED NOT DETECTED Final   Carbapenem resistance VIM NOT DETECTED NOT DETECTED Final    Comment: Performed at [facility], [address] [place], [place] [address]  Resp panel by RT-PCR (RSV, Flu A&B, Covid) Anterior Nasal Swab     Status: None   Collection Time: 10/25/23  3:47 PM   Specimen: Anterior Nasal Swab  Result Value Ref Range Status   SARS Coronavirus 2 by RT PCR NEGATIVE NEGATIVE Final   Influenza A by PCR NEGATIVE NEGATIVE Final    Influenza B by PCR NEGATIVE NEGATIVE Final   Resp Syncytial Virus by PCR NEGATIVE NEGATIVE Final    Comment: Performed at [facility], [address] [place], [place] [address]      Radiology Studies: No results found.      LOS: 3 days   Srinivas Lippman [employer] on [email or web address]  10/28/2023, 10:09 AM

## 2023-10-29 MED ORDER — CIPROFLOXACIN HCL 500 MG PO TABS: 500.0000 mg | ORAL_TABLET | Freq: Two times a day (BID) | ORAL | 0 refills | Status: AC

## 2023-10-29 MED ORDER — IBUPROFEN 400 MG PO TABS: 400.0000 mg | ORAL_TABLET | Freq: Four times a day (QID) | ORAL | Status: AC | PRN

## 2023-10-29 NOTE — Progress Notes (Signed)
Patient received discharge orders to go home. Patient was given discharge paperwork/instructions. RN went over discharge paperwork/instructions with the patient. Patient did not have any questions/concerns at this time. Patient left the hospital stable, had discharge paperwork/instructions, and had all personal belongings.

## 2023-10-29 NOTE — Discharge Summary (Signed)
 [facility]  Physician Discharge Summary   Patient ID: Trevor Brewer MRN: [id number] DOB/AGE: [birth year] 33 y.o.  Admit date: 10/25/2023 Discharge date: 10/29/2023    PCP: Trevor Brewer, Aliene, Trevor Brewer  DISCHARGE DIAGNOSES:    Sepsis secondary to UTI and prostatitis Citrobacter bacteremia   GERD (gastroesophageal reflux disease)   Bipolar 1 disorder, depressed, severe (HCC)   history of alcohol  abuse   Obesity (BMI 30-39.9)   Vapes nicotine  containing substance   Prostatitis   RECOMMENDATIONS FOR OUTPATIENT FOLLOW UP: Follow-up with PCP Needs repeat PSA in 2 months   Home Health: None Equipment/Devices: None  CODE STATUS: Full code for  DISCHARGE CONDITION: fair  Diet recommendation: Regular as tolerated  INITIAL HISTORY: 33 y.o. male with medical history significant of GERD, alcohol  use, bipolar 2 and GAD who presented to ED with fever and worsening symptoms after being seen in urgent care yesterday and being treated for a UTI with macrobid.  Patient was evaluated in the ED.  CT scan of the abdomen pelvis raised concern for prostatitis.  No abscess was noted.  Patient was noted to be febrile.  He was hospitalized for further management.    HOSPITAL COURSE:   Acute prostatitis/acute UTI/sepsis, present on admission/Citrobacter bacteremia No abscess noted on CT scan.  Nonobstructing stones were noted in the right kidney. UA noted to be abnormal.  Follow-up on cultures.  WBC remains elevated.  Lactic acid level was reassuringly normal.   Elevated PSA most likely due to acute infection.  This will need to be rechecked.  This was communicated to the patient. Patient with Citrobacter bacteremia.  Sensitivities reviewed.  Patient was transition to ciprofloxacin.  Fevers have resolved.  WBC is better.  Urine culture without any growth.     History of alcohol  abuse Sling was provided   Bipolar disorder   Hypoglycemia Glucose levels have improved.    Hyponatremia Likely due to hypovolemia.  Resolved with IV fluids.   Patient has improved.  Feels better.  Okay for discharge home today.   PERTINENT LABS:  The results of significant diagnostics from this hospitalization (including imaging, microbiology, ancillary and laboratory) are listed below for reference.    Microbiology: Recent Results (from the past 240 hours)  Urine Culture     Status: None   Collection Time: 10/25/23  3:35 PM   Specimen: Urine, Random  Result Value Ref Range Status   Specimen Description   Final    URINE, RANDOM Performed at [facility], [address], [place], [place] [address]    Special Requests   Final    NONE Reflexed from [phone] Performed at [facility], [address] [place], [place] [address]    Culture   Final    NO GROWTH Performed at [facility], [address], [place], [place] [address]    Report Status 10/27/2023 FINAL  Final  Culture, blood (Routine x 2)     Status: Abnormal   Collection Time: 10/25/23  3:45 PM   Specimen: BLOOD RIGHT ARM  Result Value Ref Range Status   Specimen Description   Final    BLOOD RIGHT ARM Performed at [facility], [address] [place], [place] [address]    Special Requests   Final    BOTTLES DRAWN AEROBIC AND ANAEROBIC Blood Culture adequate volume Performed at [facility], [address], [place], [place] [address]    Culture  Setup Time   Final  GRAM NEGATIVE RODS IN BOTH AEROBIC AND ANAEROBIC BOTTLES CRITICAL VALUE NOTED.  VALUE IS CONSISTENT WITH PREVIOUSLY REPORTED AND CALLED VALUE. Performed at [facility], [address] [place], [place] [address]    Culture (A)  Final    CITROBACTER KOSERI SUSCEPTIBILITIES PERFORMED ON PREVIOUS CULTURE WITHIN THE LAST 5 DAYS. Performed at [facility], [address] [place], [place] [address]    Report Status 10/28/2023 FINAL  Final  Culture, blood  (Routine x 2)     Status: Abnormal   Collection Time: 10/25/23  3:45 PM   Specimen: BLOOD LEFT ARM  Result Value Ref Range Status   Specimen Description   Final    BLOOD LEFT ARM Performed at [facility], [address] [place], [place] [address]    Special Requests   Final    BOTTLES DRAWN AEROBIC AND ANAEROBIC Blood Culture adequate volume Performed at [facility], [address], [place], [place] [address]    Culture  Setup Time   Final    GRAM NEGATIVE RODS IN BOTH AEROBIC AND ANAEROBIC BOTTLES CRITICAL RESULT CALLED TO, READ BACK BY AND VERIFIED WITH: JUSTIN LEGGE AT 0910 10/26/23 RAM Performed at [facility], [address], [place], [place] [address]    Culture CITROBACTER KOSERI (A)  Final   Report Status 10/28/2023 FINAL  Final   Organism ID, Bacteria CITROBACTER KOSERI  Final      Susceptibility   Citrobacter koseri - MIC*    CEFEPIME <=0.12 SENSITIVE Sensitive     ERTAPENEM <=0.12 SENSITIVE Sensitive     CEFTRIAXONE  <=0.25 SENSITIVE Sensitive     CIPROFLOXACIN <=0.06 SENSITIVE Sensitive     GENTAMICIN <=1 SENSITIVE Sensitive     MEROPENEM <=0.25 SENSITIVE Sensitive     TRIMETH/SULFA <=20 SENSITIVE Sensitive     PIP/TAZO Value in next row Sensitive      <=4 SENSITIVEThis is a modified FDA-approved test that has been validated and its performance characteristics determined by the reporting laboratory.  This laboratory is certified under the Clinical Laboratory Improvement Amendments CLIA as qualified to perform high complexity clinical laboratory testing.    * CITROBACTER KOSERI  Blood Culture ID Panel (Reflexed)     Status: Abnormal   Collection Time: 10/25/23  3:45 PM  Result Value Ref Range Status   Enterococcus faecalis NOT DETECTED NOT DETECTED Final   Enterococcus Faecium NOT DETECTED NOT DETECTED Final   Listeria monocytogenes NOT DETECTED NOT DETECTED Final   Staphylococcus species NOT DETECTED NOT DETECTED Final    Staphylococcus aureus (BCID) NOT DETECTED NOT DETECTED Final   Staphylococcus epidermidis NOT DETECTED NOT DETECTED Final   Staphylococcus lugdunensis NOT DETECTED NOT DETECTED Final   Streptococcus species NOT DETECTED NOT DETECTED Final   Streptococcus agalactiae NOT DETECTED NOT DETECTED Final   Streptococcus pneumoniae NOT DETECTED NOT DETECTED Final   Streptococcus pyogenes NOT DETECTED NOT DETECTED Final   A.calcoaceticus-baumannii NOT DETECTED NOT DETECTED Final   Bacteroides fragilis NOT DETECTED NOT DETECTED Final   Enterobacterales DETECTED (A) NOT DETECTED Final    Comment: Enterobacterales represent a large order of gram negative bacteria, not a single organism. Refer to culture for further identification. CRITICAL RESULT CALLED TO, READ BACK BY AND VERIFIED WITH: JUSTIN LEGGE AT 9090 10/26/23 RAM    Enterobacter cloacae complex NOT DETECTED NOT DETECTED Final   Escherichia coli NOT DETECTED NOT DETECTED Final   Klebsiella aerogenes NOT DETECTED NOT DETECTED Final   Klebsiella oxytoca  NOT DETECTED NOT DETECTED Final   Klebsiella pneumoniae NOT DETECTED NOT DETECTED Final   Proteus species NOT DETECTED NOT DETECTED Final   Salmonella species NOT DETECTED NOT DETECTED Final   Serratia marcescens NOT DETECTED NOT DETECTED Final   Haemophilus influenzae NOT DETECTED NOT DETECTED Final   Neisseria meningitidis NOT DETECTED NOT DETECTED Final   Pseudomonas aeruginosa NOT DETECTED NOT DETECTED Final   Stenotrophomonas maltophilia NOT DETECTED NOT DETECTED Final   Candida albicans NOT DETECTED NOT DETECTED Final   Candida auris NOT DETECTED NOT DETECTED Final   Candida glabrata NOT DETECTED NOT DETECTED Final   Candida krusei NOT DETECTED NOT DETECTED Final   Candida parapsilosis NOT DETECTED NOT DETECTED Final   Candida tropicalis NOT DETECTED NOT DETECTED Final   Cryptococcus neoformans/gattii NOT DETECTED NOT DETECTED Final   CTX-M ESBL NOT DETECTED NOT DETECTED Final    Carbapenem resistance IMP NOT DETECTED NOT DETECTED Final   Carbapenem resistance KPC NOT DETECTED NOT DETECTED Final   Carbapenem resistance NDM NOT DETECTED NOT DETECTED Final   Carbapenem resist OXA 48 LIKE NOT DETECTED NOT DETECTED Final   Carbapenem resistance VIM NOT DETECTED NOT DETECTED Final    Comment: Performed at [facility], [address] [place], [place] [address]  Resp panel by RT-PCR (RSV, Flu A&B, Covid) Anterior Nasal Swab     Status: None   Collection Time: 10/25/23  3:47 PM   Specimen: Anterior Nasal Swab  Result Value Ref Range Status   SARS Coronavirus 2 by RT PCR NEGATIVE NEGATIVE Final   Influenza A by PCR NEGATIVE NEGATIVE Final   Influenza B by PCR NEGATIVE NEGATIVE Final   Resp Syncytial Virus by PCR NEGATIVE NEGATIVE Final    Comment: Performed at [facility], [address], [place], [place] [address]     Labs:   Basic Metabolic Panel: Recent Labs  Lab 10/25/23 1541 10/25/23 1545 10/25/23 1550 10/25/23 2351 10/27/23 0400 10/28/23 0334  NA  --  133* 134* 136 133* 136  K  --  4.3 4.1 3.6 3.7 3.7  CL  --  97* 99 103 105 107  CO2  --  21*  --  21* 19* 19*  GLUCOSE  --  86 87 67* 107* 100*  BUN  --  12 12 12 6  <5*  CREATININE  --  0.90 0.90 0.88 0.72 0.76  CALCIUM  --  9.4  --  8.6* 8.6* 8.5*  MG 2.0  --   --   --   --  2.2   Liver Function Tests: Recent Labs  Lab 10/25/23 1545  AST 24  ALT 22  ALKPHOS 89  BILITOT 1.6*  PROT 8.0  ALBUMIN 4.5   CBC: Recent Labs  Lab 10/25/23 1545 10/25/23 1550 10/25/23 2351 10/27/23 0400 10/28/23 0334  WBC 14.9*  --  15.9* 15.5* 8.9  NEUTROABS 14.1*  --   --   --   --   HGB 16.0 17.3* 13.6 13.2 12.7*  HCT 50.0 51.0 42.4 40.8 39.2  MCV 85.9  --  88.3 87.6 86.5  PLT 330  --  259 205 209   CBG: Recent Labs  Lab 10/26/23 0730 10/26/23 1148 10/26/23 1655 10/27/23 0801 10/27/23 1145  GLUCAP 107* 123* 120* 99 86     IMAGING STUDIES CT Renal Stone  Study Result Date: 10/25/2023 CLINICAL DATA:  Abdominal/flank pain. Possible renal stone. Being treated for UTI. EXAM: CT ABDOMEN AND PELVIS WITHOUT CONTRAST TECHNIQUE: Multidetector CT  imaging of the abdomen and pelvis was performed following the standard protocol without IV contrast. RADIATION DOSE REDUCTION: This exam was performed according to the departmental dose-optimization program which includes automated exposure control, adjustment of the mA and/or kV according to patient size and/or use of iterative reconstruction technique. COMPARISON:  None Available. FINDINGS: Lower chest: Heart size is normal.  Lung bases are clear. Hepatobiliary: Liver, gallbladder and biliary tree are normal. Pancreas: Normal. Spleen: Normal. Adrenals/Urinary Tract: Adrenal glands are normal. Kidneys are normal in size. Couple punctate nonobstructing stones over the lower pole right kidney. No left-sided renal stones. No hydronephrosis. Ureters and bladder are unremarkable. Stomach/Bowel: Stomach and small bowel are normal. Appendix is normal. Diverticulosis of the descending colon. Vascular/Lymphatic: Abdominal aorta is normal in caliber. Remaining vascular structures are unremarkable. No adenopathy. Reproductive: Mild prominence of the prostate gland with slightly ill-defined margins and slight hazy appearance of the adjacent fat. These findings could be seen in acute prostatitis. Other: No free peritoneal fluid. Musculoskeletal: No focal abnormality. IMPRESSION: 1. No acute findings in the abdomen. 2. Mild prominence of the prostate gland with ill-defined margins and hazy appearance of the adjacent fat. These findings could be seen in acute prostatitis. 3. Couple punctate nonobstructing stones over the lower pole right kidney. 4. Diverticulosis of the descending colon. Electronically Signed   By: Toribio Agreste M.D.   On: 10/25/2023 16:44   DG Chest Port 1 View Result Date: 10/25/2023 CLINICAL DATA:  Questionable sepsis -  evaluate for abnormality EXAM: PORTABLE CHEST - 1 VIEW COMPARISON:  Jun 19, 2012 FINDINGS: Low lung volumes no focal airspace consolidation, pleural effusion, or pneumothorax. No cardiomegaly.No acute fracture or destructive lesion. IMPRESSION: Low lung volumes.  Otherwise, no acute cardiopulmonary abnormality. Electronically Signed   By: Rogelia Myers M.D.   On: 10/25/2023 16:38    DISCHARGE EXAMINATION: Vitals:   10/28/23 0552 10/28/23 1416 10/28/23 2203 10/29/23 0514  BP: (!) 136/90 (!) 134/93 (!) 132/94 125/79  Pulse: 77 97 77 72  Resp: 19 18 18 18   Temp: 98.5 F (36.9 C) 99.2 F (37.3 C) 98.7 F (37.1 C) 99 F (37.2 C)  TempSrc: Oral Oral Oral Oral  SpO2: 100% 100% 100% 99%  Weight:      Height:       General appearance: Awake alert.  In no distress Resp: Clear to auscultation bilaterally.  Normal effort Cardio: S1-S2 is normal regular.  No S3-S4.  No rubs murmurs or bruit GI: Abdomen is soft.  Nontender nondistended.  Bowel sounds are present normal.  No masses organomegaly  DISPOSITION: Home  Discharge Instructions     Call Trevor Brewer for:  difficulty breathing, headache or visual disturbances   Complete by: As directed    Call Trevor Brewer for:  extreme fatigue   Complete by: As directed    Call Trevor Brewer for:  persistant dizziness or light-headedness   Complete by: As directed    Call Trevor Brewer for:  persistant nausea and vomiting   Complete by: As directed    Call Trevor Brewer for:  severe uncontrolled pain   Complete by: As directed    Call Trevor Brewer for:  temperature >100.4   Complete by: As directed    Diet - low sodium heart healthy   Complete by: As directed    Discharge instructions   Complete by: As directed    Please take your medications as prescribed.  Please be sure to follow-up with your primary care provider within 1 week after discharge.  You should  have your PSA rechecked in 2 months time.  Avoid excessive consumption of alcohol .    You were cared for by a hospitalist during your hospital  stay. If you have any questions about your discharge medications or the care you received while you were in the hospital after you are discharged, you can call the unit and asked to speak with the hospitalist on call if the hospitalist that took care of you is not available. Once you are discharged, your primary care physician will handle any further medical issues. Please note that NO REFILLS for any discharge medications will be authorized once you are discharged, as it is imperative that you return to your primary care physician (or establish a relationship with a primary care physician if you do not have one) for your aftercare needs so that they can reassess your need for medications and monitor your lab values. If you do not have a primary care physician, you can call [phone] for a physician referral.   Increase activity slowly   Complete by: As directed          Allergies as of 10/29/2023   No Known Allergies      Medication List     STOP taking these medications    ARIPiprazole  10 MG tablet Commonly known as: ABILIFY    baclofen  10 MG tablet Commonly known as: LIORESAL    busPIRone  10 MG tablet Commonly known as: BUSPAR    DULoxetine  30 MG capsule Commonly known as: CYMBALTA    hydrOXYzine  25 MG tablet Commonly known as: ATARAX    lamoTRIgine  25 MG tablet Commonly known as: LAMICTAL    multivitamin with minerals Tabs tablet   nitrofurantoin (macrocrystal-monohydrate) 100 MG capsule Commonly known as: MACROBID   traZODone  50 MG tablet Commonly known as: DESYREL        TAKE these medications    Apretude 600 MG/3ML injection Generic drug: cabotegravir ER Inject 600 mg into the muscle every 2 (two) months.   ciprofloxacin 500 MG tablet Commonly known as: CIPRO Take 1 tablet (500 mg total) by mouth 2 (two) times daily for 7 days.   ibuprofen  400 MG tablet Commonly known as: ADVIL  Take 1 tablet (400 mg total) by mouth every 6 (six) hours as needed for fever or  mild pain (pain score 1-3).   lisdexamfetamine 60 MG capsule Commonly known as: VYVANSE Take 60 mg by mouth in the morning.   metFORMIN 500 MG 24 hr tablet Commonly known as: GLUCOPHAGE-XR Take 500 mg by mouth daily with breakfast.   omeprazole 40 MG capsule Commonly known as: PRILOSEC Take 40 mg by mouth daily before breakfast.   Zepbound 15 MG/0.5ML Pen Generic drug: tirzepatide Inject 15 mg into the skin See admin instructions. Inject 15 mg into the skin on Tuesdays or Wednesdays- ONCE A WEEK          Follow-up Information     Trevor Brewer, Aliene, Trevor Brewer Follow up in 1 week(s).   Specialty: Family Medicine Why: post hospitalization follow up Contact information: [address] [place] [address] [phone]                 TOTAL DISCHARGE TIME: 35 minutes  Safire Gordin Verdene  [facility] Pager on [email or web address]  10/29/2023, 11:12 AM
